# Patient Record
Sex: Female | Born: 1945 | Race: White | Hispanic: No | Marital: Married | Smoking: Never smoker
Health system: Southern US, Community
[De-identification: ages and names within clinical notes are randomized; demographics above are authoritative.]

## PROBLEM LIST (undated history)

## (undated) DIAGNOSIS — J302 Other seasonal allergic rhinitis: Secondary | ICD-10-CM

## (undated) DIAGNOSIS — F32A Depression, unspecified: Secondary | ICD-10-CM

## (undated) DIAGNOSIS — C189 Malignant neoplasm of colon, unspecified: Secondary | ICD-10-CM

## (undated) DIAGNOSIS — F0390 Unspecified dementia without behavioral disturbance: Secondary | ICD-10-CM

## (undated) DIAGNOSIS — F329 Major depressive disorder, single episode, unspecified: Secondary | ICD-10-CM

## (undated) DIAGNOSIS — C50919 Malignant neoplasm of unspecified site of unspecified female breast: Secondary | ICD-10-CM

## (undated) DIAGNOSIS — R0989 Other specified symptoms and signs involving the circulatory and respiratory systems: Secondary | ICD-10-CM

## (undated) HISTORY — PX: TUBAL LIGATION: SHX77

## (undated) HISTORY — PX: GALLBLADDER SURGERY: SHX652

## (undated) HISTORY — DX: Other seasonal allergic rhinitis: J30.2

## (undated) HISTORY — DX: Depression, unspecified: F32.A

## (undated) HISTORY — PX: OTHER SURGICAL HISTORY: SHX169

## (undated) HISTORY — PX: BREAST SURGERY: SHX581

## (undated) HISTORY — DX: Other specified symptoms and signs involving the circulatory and respiratory systems: R09.89

## (undated) HISTORY — DX: Malignant neoplasm of colon, unspecified: C18.9

## (undated) HISTORY — DX: Major depressive disorder, single episode, unspecified: F32.9

## (undated) HISTORY — DX: Malignant neoplasm of unspecified site of unspecified female breast: C50.919

## (undated) HISTORY — PX: HERNIA REPAIR: SHX51

---

## 2001-09-29 ENCOUNTER — Encounter (HOSPITAL_BASED_OUTPATIENT_CLINIC_OR_DEPARTMENT_OTHER): Payer: Self-pay | Admitting: General Surgery

## 2001-10-01 ENCOUNTER — Ambulatory Visit (HOSPITAL_COMMUNITY): Admission: RE | Admit: 2001-10-01 | Discharge: 2001-10-02 | Payer: Self-pay | Admitting: General Surgery

## 2008-01-13 ENCOUNTER — Ambulatory Visit: Payer: Self-pay | Admitting: Nurse Practitioner

## 2009-03-15 ENCOUNTER — Ambulatory Visit: Payer: Self-pay | Admitting: Nurse Practitioner

## 2009-11-19 DIAGNOSIS — C50919 Malignant neoplasm of unspecified site of unspecified female breast: Secondary | ICD-10-CM

## 2009-11-19 HISTORY — PX: MASTECTOMY: SHX3

## 2009-11-19 HISTORY — DX: Malignant neoplasm of unspecified site of unspecified female breast: C50.919

## 2010-04-18 ENCOUNTER — Ambulatory Visit: Payer: Self-pay | Admitting: Family Medicine

## 2010-05-02 ENCOUNTER — Ambulatory Visit: Payer: Self-pay | Admitting: Family Medicine

## 2010-07-07 IMAGING — US ULTRASOUND LEFT BREAST
1 series · 8 of 8 positions shown · non-contrast
Comparison: none

REASON FOR EXAM: DIAGNOSTIC MAMMO LT BR DISCHARGE
COMMENTS:

PROCEDURE:     US  - US BREAST LEFT  - April 18, 2010  [DATE]
RESULT:      The periareolar region the left breast was scanned
sonographically.    No cystic or solid masses are identified.  No ductal
dilation was evident.

[Series 1: ultrasound left breast · 8 of 8 slices shown]
[im 1/8]
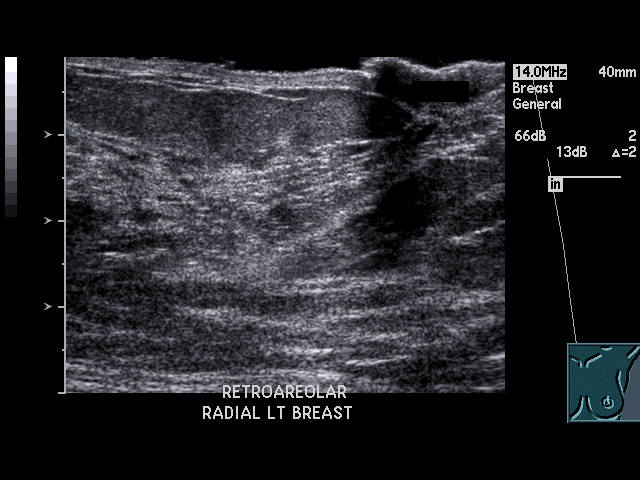
[im 2/8]
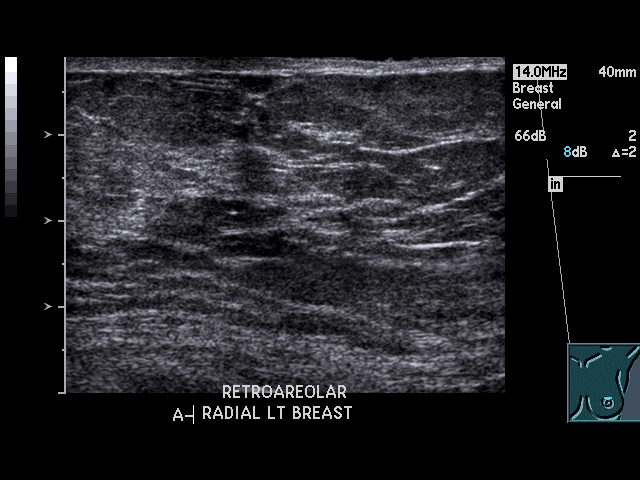
[im 3/8]
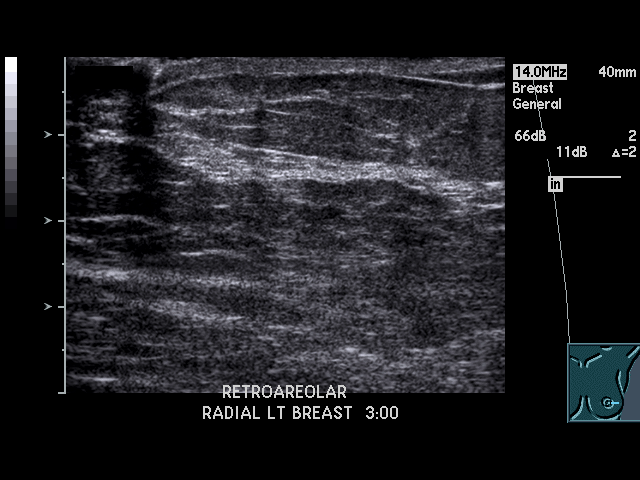
[im 4/8]
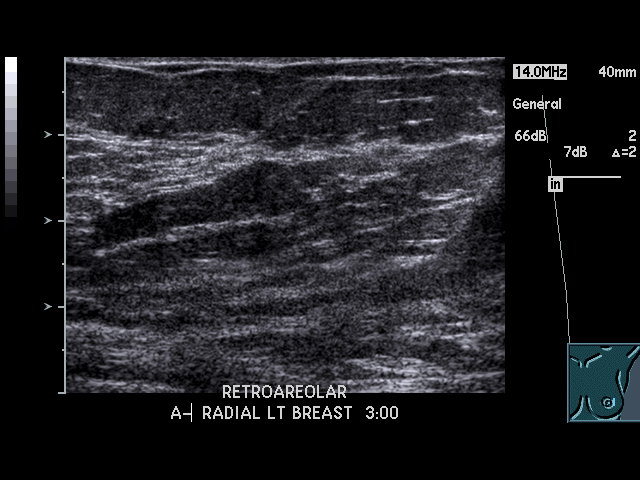
[im 5/8]
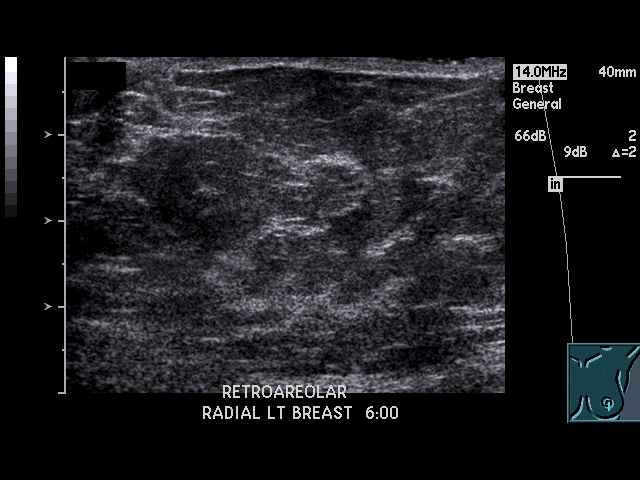
[im 6/8]
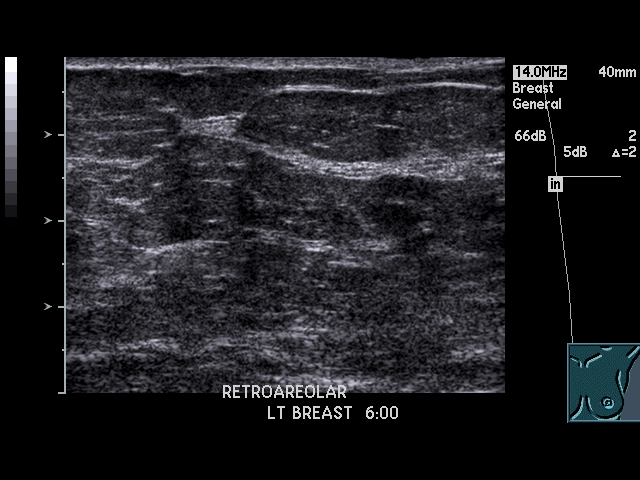
[im 7/8]
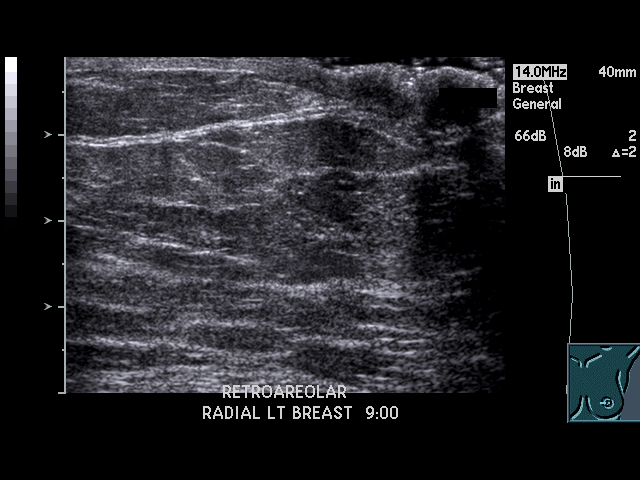
[im 8/8]
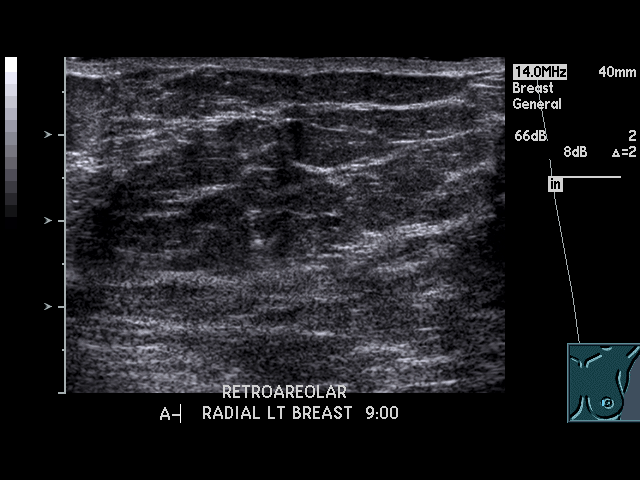

[8 of 8 positions shown; findings below may reference images not displayed]

IMPRESSION: Negative targeted ultrasound of the left breast.

## 2012-07-01 DIAGNOSIS — Z531 Procedure and treatment not carried out because of patient's decision for reasons of belief and group pressure: Secondary | ICD-10-CM | POA: Insufficient documentation

## 2012-07-01 DIAGNOSIS — IMO0001 Reserved for inherently not codable concepts without codable children: Secondary | ICD-10-CM | POA: Insufficient documentation

## 2012-07-01 DIAGNOSIS — F329 Major depressive disorder, single episode, unspecified: Secondary | ICD-10-CM | POA: Insufficient documentation

## 2012-07-01 DIAGNOSIS — E039 Hypothyroidism, unspecified: Secondary | ICD-10-CM | POA: Insufficient documentation

## 2012-07-01 DIAGNOSIS — D0510 Intraductal carcinoma in situ of unspecified breast: Secondary | ICD-10-CM | POA: Insufficient documentation

## 2012-07-01 DIAGNOSIS — Z789 Other specified health status: Secondary | ICD-10-CM | POA: Insufficient documentation

## 2012-07-01 DIAGNOSIS — C19 Malignant neoplasm of rectosigmoid junction: Secondary | ICD-10-CM | POA: Insufficient documentation

## 2012-07-01 DIAGNOSIS — J45909 Unspecified asthma, uncomplicated: Secondary | ICD-10-CM | POA: Insufficient documentation

## 2012-07-04 DIAGNOSIS — K219 Gastro-esophageal reflux disease without esophagitis: Secondary | ICD-10-CM | POA: Insufficient documentation

## 2012-07-04 DIAGNOSIS — K439 Ventral hernia without obstruction or gangrene: Secondary | ICD-10-CM | POA: Insufficient documentation

## 2012-09-08 DIAGNOSIS — G3184 Mild cognitive impairment, so stated: Secondary | ICD-10-CM | POA: Insufficient documentation

## 2013-11-09 ENCOUNTER — Ambulatory Visit (INDEPENDENT_AMBULATORY_CARE_PROVIDER_SITE_OTHER): Payer: Medicare Other

## 2013-11-09 ENCOUNTER — Encounter: Payer: Self-pay | Admitting: Podiatry

## 2013-11-09 ENCOUNTER — Ambulatory Visit (INDEPENDENT_AMBULATORY_CARE_PROVIDER_SITE_OTHER): Payer: Medicare Other | Admitting: Podiatry

## 2013-11-09 VITALS — BP 124/69 | HR 74 | Resp 16 | Ht 67.0 in | Wt 209.0 lb

## 2013-11-09 DIAGNOSIS — M2010 Hallux valgus (acquired), unspecified foot: Secondary | ICD-10-CM

## 2013-11-09 DIAGNOSIS — M2040 Other hammer toe(s) (acquired), unspecified foot: Secondary | ICD-10-CM

## 2013-11-09 DIAGNOSIS — L84 Corns and callosities: Secondary | ICD-10-CM

## 2013-11-09 DIAGNOSIS — L97509 Non-pressure chronic ulcer of other part of unspecified foot with unspecified severity: Secondary | ICD-10-CM

## 2013-11-09 MED ORDER — CLINDAMYCIN HCL 150 MG PO CAPS: 150.0000 mg | ORAL_CAPSULE | Freq: Three times a day (TID) | ORAL | Status: DC

## 2013-11-09 NOTE — Progress Notes (Signed)
   Subjective:    Patient ID: Maria Mays, female    DOB: [birth year], 67 y.o.   MRN: [id number]  HPI Comments: N pain L right foot 2nd digit corn/2nd digit left/toenail 2nd right D ? O slowly C worse A ? T soaks in epson salt, antibiotic cream,    Foot Pain Associated symptoms include headaches.      Review of Systems  Constitutional: Negative.   HENT:       Sinus problems  Eyes: Negative.   Respiratory: Negative.   Cardiovascular: Negative.   Gastrointestinal: Positive for diarrhea.  Endocrine: Negative.   Genitourinary: Negative.   Musculoskeletal:       Joint pain  Skin:       Change in nails  Allergic/Immunologic: Positive for environmental allergies and food allergies.  Neurological: Positive for headaches.  Hematological: Negative.   Psychiatric/Behavioral: Positive for confusion. The patient is nervous/anxious.        Objective:   Physical Exam: I have reviewed her past medical history medications allergies surgeries social history and review of systems. Vital signs are stable she is alert and oriented x3. Pulses are strongly palpable bilateral, +2/4 DP and PT bilateral. Capillary fill time to digits one through 5 of the bilateral foot is immediate. Neurologic sensorium is intact per Semmes-Weinstein monofilament. Deep tendon reflexes are intact bilateral muscle strength is 5 over 5 dorsiflexors plantar flexors inverters everters all intrinsic musculature is intact. 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 Hammertoe deformity second digit bilateral. This deformity is resulting in irritation and skin breakdown to the medial aspect of the PIPJ second digit of the right foot with surrounding area of reactive hyperkeratosis. Upon debridement of this area it does not probe to bone nor does it probe to capsule superficial skin breakdown is noted but does demonstrate an area of cellulitis. Radiographic evaluation  demonstrates moderate to severe hallux abductovalgus deformities bilateral. With severe hammertoe deformities second bilateral. I am unable to appreciate any signs of osteomyelitis to the second digit at this point. Cutaneous evaluation demonstrates supple well hydrated cutis with exception of the after mentioned lesion medial aspect PIPJ second digit right foot with cellulitis.        Assessment & Plan:  Assessment: Cellulitis secondary to anatomical deformity and superficial skin breakdown medial aspect second digit right foot.  Plan: Debridement of reactive hyperkeratosis. Discussed soaking and dressing therapy. I also put her on an oral antibiotic consisting of clindamycin 150 mg 3 times a day. I will followup with her in 3 weeks

## 2013-12-07 ENCOUNTER — Encounter: Payer: Self-pay | Admitting: Podiatry

## 2013-12-07 ENCOUNTER — Ambulatory Visit (INDEPENDENT_AMBULATORY_CARE_PROVIDER_SITE_OTHER): Payer: Medicare Other | Admitting: Podiatry

## 2013-12-07 VITALS — BP 111/70 | HR 68 | Resp 16 | Ht 67.0 in | Wt 210.0 lb

## 2013-12-07 DIAGNOSIS — M2010 Hallux valgus (acquired), unspecified foot: Secondary | ICD-10-CM

## 2013-12-07 DIAGNOSIS — L02619 Cutaneous abscess of unspecified foot: Secondary | ICD-10-CM

## 2013-12-07 DIAGNOSIS — L03119 Cellulitis of unspecified part of limb: Secondary | ICD-10-CM

## 2013-12-07 NOTE — Progress Notes (Signed)
She presents today to discuss her bunion deformities and her second toes with [place]. She states that the second toe and the erythema and infection has gotten better. She's finished up her antibiotics. She continues to soak daily.  Objective: Vital signs are stable she is alert and oriented x3. She has severe HAV deformities bilateral foot resulting in cockup hammertoe deformities and juxtaposition is resulting in reactive hyperkeratosis with skin breakdown. Right greater than left.  Assessment: Hallux valgus well-healing cellulitis right. Hammertoe deformities 2 through 5 bilateral.  Plan: Discussed etiology pathology conservative versus surgical therapies. And I will followup with her when she is ready for surgical intervention.

## 2014-08-21 ENCOUNTER — Ambulatory Visit: Payer: Self-pay | Admitting: Family Medicine

## 2014-09-09 ENCOUNTER — Ambulatory Visit: Payer: Self-pay | Admitting: Family Medicine

## 2014-09-29 ENCOUNTER — Ambulatory Visit (INDEPENDENT_AMBULATORY_CARE_PROVIDER_SITE_OTHER): Payer: Medicare Other | Admitting: Podiatry

## 2014-09-29 VITALS — BP 118/68 | HR 79 | Resp 16

## 2014-09-29 DIAGNOSIS — L03119 Cellulitis of unspecified part of limb: Secondary | ICD-10-CM

## 2014-09-29 DIAGNOSIS — M2041 Other hammer toe(s) (acquired), right foot: Secondary | ICD-10-CM

## 2014-09-29 DIAGNOSIS — L02619 Cutaneous abscess of unspecified foot: Secondary | ICD-10-CM

## 2014-09-29 MED ORDER — MUPIROCIN 2 % EX OINT: TOPICAL_OINTMENT | CUTANEOUS | Status: DC

## 2014-09-29 NOTE — Progress Notes (Signed)
She presents today with a painful callus to the second digit of the right foot. She states there has been some drainage. She is currently taking clindamycin from a previous doctor however she is not taking it regularly. She states that the toe still hurts as well as the hallux.  Objective: Pulses are palpable right foot. Hammertoe deformity second digit of the right foot in juxtaposition with hallux valgus deformity resulting in an irritation of the medial aspect of the DIPJ in the lateral aspect of the IP joint of the hallux right. Overlying the DIPJ medial aspect of the second toe right foot is a reactive hyperkeratosis was debrided reveals a superficial ulceration and abscess. This abscess was incised and drained. There was not enough purulence for culture. This didn't probe deep to capsule however it does not demonstrate signs of osteomyelitis.  Assessment: Abscess with digital deformity second digit right foot superficial ulceration second digit right foot.  Plan: Continue clindamycin 300 mg 3 times a day for the next 7 days which she has remaining in her pill bottle. I also wrote a prescription for Bactroban ointment to be applied twice daily after soaking in Epsom salts and water. She will then dressed the toe with a light dressing and we also dispensed a Darco shoe. I will follow-up with her in 2 weeks. X-rays will be taken at that time.

## 2014-10-20 ENCOUNTER — Ambulatory Visit (INDEPENDENT_AMBULATORY_CARE_PROVIDER_SITE_OTHER): Payer: Medicare Other | Admitting: Podiatry

## 2014-10-20 ENCOUNTER — Ambulatory Visit (INDEPENDENT_AMBULATORY_CARE_PROVIDER_SITE_OTHER): Payer: Medicare Other

## 2014-10-20 VITALS — BP 107/62 | HR 74 | Resp 16

## 2014-10-20 DIAGNOSIS — L97511 Non-pressure chronic ulcer of other part of right foot limited to breakdown of skin: Secondary | ICD-10-CM

## 2014-10-20 DIAGNOSIS — M2041 Other hammer toe(s) (acquired), right foot: Secondary | ICD-10-CM

## 2014-10-21 NOTE — Progress Notes (Signed)
She presents today for follow-up of ulceration to toe right foot. She states it is just so sore.  Objective: Ulceration without complication right foot.  Assessment: Ulceration right.  Plan: Debridement of one today. Continue cervical therapies follow up with me in the near future.

## 2014-11-02 ENCOUNTER — Emergency Department: Payer: Self-pay | Admitting: Emergency Medicine

## 2014-11-05 DIAGNOSIS — S52502A Unspecified fracture of the lower end of left radius, initial encounter for closed fracture: Secondary | ICD-10-CM | POA: Insufficient documentation

## 2014-11-08 ENCOUNTER — Telehealth: Payer: Self-pay | Admitting: *Deleted

## 2014-11-08 NOTE — Telephone Encounter (Signed)
Pt state she and her husband have an appt 11/22/2014 with Dr. Milinda Pointer, and at a previous appt she was instructed to get new shoes.  Pt states she has not purchased new shoes yet and wondered if she needed to keep the appt.

## 2014-11-08 NOTE — Telephone Encounter (Signed)
TRIED CALLING WORK # AND THEY STATED I WAS CALLING AN ANSWERING SERVICE OF [facility] AND NO ONE IS THERE BY THAT NAME.

## 2014-11-08 NOTE — Telephone Encounter (Signed)
TRIED CALLING PT BACK REGARDING HER APPT. WAS UNABLE TO LEAVE A MESSAGE.

## 2014-11-22 ENCOUNTER — Ambulatory Visit: Payer: Medicare Other | Admitting: Podiatry

## 2014-12-24 ENCOUNTER — Ambulatory Visit: Payer: Self-pay | Admitting: Family Medicine

## 2016-02-01 ENCOUNTER — Other Ambulatory Visit: Payer: Self-pay | Admitting: Family Medicine

## 2016-02-02 ENCOUNTER — Other Ambulatory Visit: Payer: Self-pay | Admitting: Family Medicine

## 2016-02-02 DIAGNOSIS — Z1231 Encounter for screening mammogram for malignant neoplasm of breast: Secondary | ICD-10-CM

## 2016-02-10 ENCOUNTER — Other Ambulatory Visit: Payer: Self-pay | Admitting: Family Medicine

## 2016-02-10 ENCOUNTER — Ambulatory Visit
Admission: RE | Admit: 2016-02-10 | Discharge: 2016-02-10 | Disposition: A | Discharge status: Home / Self Care | Payer: Medicare HMO | Source: Ambulatory Visit | Attending: Family Medicine | Admitting: Family Medicine

## 2016-02-10 DIAGNOSIS — Z1231 Encounter for screening mammogram for malignant neoplasm of breast: Secondary | ICD-10-CM

## 2016-03-06 ENCOUNTER — Other Ambulatory Visit: Payer: Self-pay | Admitting: Family Medicine

## 2016-03-06 DIAGNOSIS — Z780 Asymptomatic menopausal state: Secondary | ICD-10-CM

## 2016-03-22 ENCOUNTER — Ambulatory Visit
Admission: RE | Admit: 2016-03-22 | Discharge: 2016-03-22 | Disposition: A | Discharge status: Home / Self Care | Payer: Medicare HMO | Source: Ambulatory Visit | Attending: Family Medicine | Admitting: Family Medicine

## 2016-03-22 DIAGNOSIS — M8580 Other specified disorders of bone density and structure, unspecified site: Secondary | ICD-10-CM | POA: Insufficient documentation

## 2016-03-22 DIAGNOSIS — Z780 Asymptomatic menopausal state: Secondary | ICD-10-CM | POA: Diagnosis not present

## 2016-03-22 DIAGNOSIS — Z13820 Encounter for screening for osteoporosis: Secondary | ICD-10-CM | POA: Diagnosis not present

## 2016-05-18 ENCOUNTER — Ambulatory Visit: Payer: Medicare HMO | Admitting: Sports Medicine

## 2017-02-12 ENCOUNTER — Encounter: Payer: Self-pay | Admitting: Neurology

## 2017-03-27 ENCOUNTER — Ambulatory Visit (INDEPENDENT_AMBULATORY_CARE_PROVIDER_SITE_OTHER): Payer: Medicare HMO | Admitting: Podiatry

## 2017-03-27 DIAGNOSIS — Q828 Other specified congenital malformations of skin: Secondary | ICD-10-CM | POA: Diagnosis not present

## 2017-03-27 DIAGNOSIS — M7751 Other enthesopathy of right foot: Secondary | ICD-10-CM

## 2017-03-27 DIAGNOSIS — M779 Enthesopathy, unspecified: Principal | ICD-10-CM

## 2017-03-27 DIAGNOSIS — M778 Other enthesopathies, not elsewhere classified: Secondary | ICD-10-CM

## 2017-03-27 NOTE — Progress Notes (Signed)
She presents today with chief complaint of pain to the second digit of the right foot. She states that she has a sore callused area present for several years. She states that she is using antibiotic ointment and Epsom salt soaks which did help some.  Objective: Vital signs are stable she's alert and oriented 3 pulses are palpable. She has mild hallux valgus deformity with overlapping second digit this resulting in an irritation due to juxtaposition along the medial aspect of the PIPJ second digit right foot. There is fluctuance beneath the skin which indicates probable bursitis radiographs taken today demonstrates hallux valgus with hammertoe deformity.  Assessment: Bursitis hammertoe deformity hallux valgus right foot.  Plan: Debrided reactive hyperkeratotic lesion today injected the PIPJ and bursitis D Smith as a local anesthetic provided padding and discussed appropriate shoe gear will follow up with her as needed.

## 2017-04-23 ENCOUNTER — Ambulatory Visit (INDEPENDENT_AMBULATORY_CARE_PROVIDER_SITE_OTHER): Payer: Medicare HMO | Admitting: Podiatry

## 2017-04-23 DIAGNOSIS — L84 Corns and callosities: Secondary | ICD-10-CM | POA: Diagnosis not present

## 2017-04-23 MED ORDER — CEPHALEXIN 500 MG PO CAPS
500.0000 mg | ORAL_CAPSULE | Freq: Two times a day (BID) | ORAL | 0 refills | Status: DC
Start: 2017-04-23 — End: 2018-07-25

## 2017-04-23 MED ORDER — MUPIROCIN 2 % EX OINT: 1.0000 "application " | TOPICAL_OINTMENT | Freq: Two times a day (BID) | CUTANEOUS | 0 refills | Status: DC

## 2017-04-24 ENCOUNTER — Telehealth: Payer: Self-pay | Admitting: *Deleted

## 2017-04-24 MED ORDER — MUPIROCIN 2 % EX OINT: TOPICAL_OINTMENT | CUTANEOUS | 0 refills | Status: DC

## 2017-04-24 NOTE — Progress Notes (Signed)
   Subjective: Patient is a 71 year old female presenting today with a complaint of a wound to the second toe of the right foot. She states the wound is healing but she is still experiencing some swelling and soreness to the touch. She denies drainage. She has been soaking the foot in Epsom salt which helps alleviate the pain. She did not finish the cephalexin prescribed because she misplaced the bottle.  Objective:  Physical Exam General: Alert and oriented x3 in no acute distress  Dermatology: Soft corn noted to medial aspect of second digit of the right foot. Pain on palpation with a central nucleated core noted.  Skin is warm, dry and supple bilateral lower extremities. Negative for open lesions or macerations.  Vascular: Palpable pedal pulses bilaterally. No edema or erythema noted. Capillary refill within normal limits.  Neurological: Epicritic and protective threshold grossly intact bilaterally.   Musculoskeletal Exam: Pain on palpation at the keratotic lesion noted. Range of motion within normal limits bilateral. Muscle strength 5/5 in all groups bilateral.  Assessment: #1 soft corn second digit right   Plan of Care:  #1 Patient evaluated #2 Excisional debridement of keratoic lesion using a chisel blade was performed without incident.  #3 Treated area(s) with Salinocaine and dressed with light dressing. #4 prescription for mupirocin ointment given to patient. #5 prescription for Keflex 500 mg #10 given to patient. #6 Patient is to return to the clinic PRN.   Edrick Kins, DPM [facility]  Dr. Edrick Kins, [address]                                        [place], [place] [address]                Office [phone]  Fax [phone]

## 2017-04-24 NOTE — Telephone Encounter (Addendum)
Maria Mays states needs verification for [employer] ointment. Escripted to put Bactroban ointment on affected area. Maria Mays states needs to find out where the ointment is to be applied. Left message at pt's pharmacy to apply the Mupirocin ointment to the right 2nd toe area.

## 2017-04-26 ENCOUNTER — Ambulatory Visit: Payer: Medicare HMO | Admitting: Neurology

## 2017-05-10 ENCOUNTER — Ambulatory Visit: Payer: Medicare HMO | Admitting: Podiatry

## 2017-05-17 ENCOUNTER — Ambulatory Visit (INDEPENDENT_AMBULATORY_CARE_PROVIDER_SITE_OTHER): Payer: Medicare HMO

## 2017-05-17 ENCOUNTER — Ambulatory Visit (INDEPENDENT_AMBULATORY_CARE_PROVIDER_SITE_OTHER): Payer: Medicare HMO | Admitting: Podiatry

## 2017-05-17 DIAGNOSIS — M2041 Other hammer toe(s) (acquired), right foot: Secondary | ICD-10-CM | POA: Diagnosis not present

## 2017-05-17 DIAGNOSIS — L84 Corns and callosities: Secondary | ICD-10-CM

## 2017-05-17 DIAGNOSIS — M21619 Bunion of unspecified foot: Secondary | ICD-10-CM

## 2017-05-17 DIAGNOSIS — M799 Soft tissue disorder, unspecified: Secondary | ICD-10-CM | POA: Diagnosis not present

## 2017-05-17 DIAGNOSIS — M7989 Other specified soft tissue disorders: Secondary | ICD-10-CM

## 2017-05-17 NOTE — Progress Notes (Signed)
   HPI: 71 year old female presents today for evaluation of a soft tissue mass to the medial aspect of her second digit right foot. Patient also has a concurrent bunion and hammertoe to the respective digit. Patient states that she still experiences some pain and tenderness although the callus overlying the soft tissue lesion was debrided last visit. She's been soaking the foot is still very sore. Patient presents today for further treatment and evaluation   Physical Exam: General: The patient is alert and oriented x3 in no acute distress.  Dermatology: Skin is warm, dry and supple bilateral lower extremities. Negative for open lesions or macerations.  Vascular: Palpable pedal pulses bilaterally. No edema or erythema noted. Capillary refill within normal limits.  Neurological: Epicritic and protective threshold grossly intact bilaterally.   Musculoskeletal Exam: There is a soft tissue mass noted to the medial aspect of the second digit right foot just overlying the DIPJ approximately 1 cm in diameter. Mass is palpable and appears non-adhered to underlying bone structure. There is also hammertoe contracture deformity of the second digit right foot with a bunion deformity of the right foot as well. The hammertoe contracture appears laterally deviated the level of the DIPJ second digit right foot  Radiographic Exam:  Increased intermetatarsal angle greater than 15 with a hallux abductus angle greater than 30 consistent with bunion deformity. Hammertoe contracture deformity with lateral deviation of the DIPJ second digit right foot. Joint spaces appear preserved with no fracture identified.  Assessment: 1. Soft tissue mass second digit right foot 2. Hammertoe contracture second digit right foot 3. Hallux abductovalgus deformity right foot  Plan of Care:  1. Patient was evaluated. X-rays reviewed today 2. Today we discussed additional conservative versus surgical management of the soft tissue  lesion as well as bunion and hammertoe which are contributory. At this point surgery appears to be warranted. There's been no alleviation of symptoms for the patient regarding conservative modalities. 3. We're going to stage the surgery. For surgery will be excision of the soft tissue lesion second digit right foot. This will be performed as an in office procedure in [place]. Authorization for surgery initiated today. 4. The second stage of the procedure will be to correct for the bunion and hammertoe deformity. This will be performed in the surgery center. We will obtain authorization once the soft tissue lesion incision heals. 5. Patient did have a concern regarding anesthesia at the surgery center. 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 Patient would not like to have Gen. anesthesia during the surgery. 6. Return to the office in [facility] for surgical excision of the soft tissue mass.   Edrick Kins, DPM [facility]  Dr. Edrick Kins, [address]                                        [place], [place] [address]                Office [phone]  Fax [phone]

## 2017-05-21 ENCOUNTER — Telehealth: Payer: Self-pay | Admitting: *Deleted

## 2017-05-21 NOTE — Telephone Encounter (Signed)
"  I need to schedule surgery on my right foot.  Please give me a call."  I'm returning your call.  You want to schedule surgery?  "Yes, I do but I'm going to need for you to speak to my daughter because she will be the one bringing me.  The number is [phone] and her name is [name]."  I left [name] a message to give me a call to set up an appointment for her mother.

## 2017-05-30 NOTE — Telephone Encounter (Signed)
"  I'm calling to set up my mom's surgery."  Do you have a date in mind?  "I would like for it to be scheduled after the twelfth of August."  Dr. Amalia Hailey can do it here in the [facility] office on 07/03/2017.  "Okay, that date will be fine."  She needs to be here at 7:45am.  "She is to use that scrub brush before she comes?"  Yes, she uses it to clean her foot the night before surgery date.

## 2017-06-13 ENCOUNTER — Telehealth: Payer: Self-pay | Admitting: *Deleted

## 2017-06-13 NOTE — Telephone Encounter (Signed)
"  I'm scheduled for surgery on Wednesday the 15th at the [place] location.  I just have a couple of questions if you could give me a call back, one thing I was wondering about is washing the foot the night before and day of surgery should I not have food.  I need more information about preparation.

## 2017-06-14 ENCOUNTER — Ambulatory Visit: Payer: Medicare HMO | Admitting: Neurology

## 2017-06-26 NOTE — Telephone Encounter (Signed)
I attempted to return the patient's call.

## 2017-07-01 NOTE — Telephone Encounter (Signed)
I am calling on behalf of my mother.  She's scheduled for surgery on Wednesday.  She is very worried so I am calling to ask some questions.  She wants to know what she needs to do prior to the surgery.  She said she has a scrub brush and needs to know when to use it."  She is to clean her foot the night before.  All she has to do is wet it and scrub the foot the night before.  "Does she have to put anything on it afterwards?"  She does not have to put anything on it.  "Where are you located?"  We are located at [address], [place] [address] from [employer].  "Okay, that helps.  Will she be wearing a shoe or a boot after surgery?"  She will receive a surgical shoe the day of surgery.  "Is there anything she will have to do after her surgery?"  She will get her instruction sheet when she comes in for the surgery.  It means elevating foot as much as possible and drinking plenty of fluids.  "Okay, we'll see you Wednesday."

## 2017-07-02 ENCOUNTER — Telehealth: Payer: Self-pay

## 2017-07-02 NOTE — Telephone Encounter (Signed)
Spoke with patient instructing her on pre-op instructions and arrival time.

## 2017-07-03 ENCOUNTER — Telehealth: Payer: Self-pay | Admitting: *Deleted

## 2017-07-03 ENCOUNTER — Ambulatory Visit (INDEPENDENT_AMBULATORY_CARE_PROVIDER_SITE_OTHER): Payer: Medicare HMO | Admitting: Podiatry

## 2017-07-03 ENCOUNTER — Other Ambulatory Visit: Payer: Self-pay | Admitting: Podiatry

## 2017-07-03 ENCOUNTER — Encounter: Payer: Self-pay | Admitting: Podiatry

## 2017-07-03 VITALS — BP 120/69 | HR 80 | Temp 96.8°F | Resp 16

## 2017-07-03 DIAGNOSIS — M799 Soft tissue disorder, unspecified: Secondary | ICD-10-CM

## 2017-07-03 DIAGNOSIS — M7989 Other specified soft tissue disorders: Secondary | ICD-10-CM

## 2017-07-03 MED ORDER — TRAMADOL HCL 50 MG PO TABS: 50.0000 mg | ORAL_TABLET | Freq: Four times a day (QID) | ORAL | 0 refills | Status: DC | PRN

## 2017-07-03 NOTE — Telephone Encounter (Signed)
I called and informed [name] that her mom forgot her prescription.  She can go by the [place] office to get one.

## 2017-07-04 NOTE — Progress Notes (Signed)
OPERATIVE REPORT Patient name: Maria Mays MRN: [id number] DOB: 1946-08-30  DOS:  07/03/2017  Preop Dx: Soft tissue mass second digit right foot Postop Dx: same  Procedure:  1. Excision of soft tissue tumor/mass second digit right foot  Surgeon: Edrick Kins DPM  Anesthesia: 50-50 mixture of 2% lidocaine plain with 0.5% Marcaine plain totaling 3 mL infiltrated in the patient's right lower extremity  Hemostasis: Ankle tourniquet inflated to a pressure of 229mmHg after esmarch exsanguination   EBL: 1 mL Materials: None Injectables: None Pathology: Soft tissue tumor right second digit  Condition: The patient tolerated the procedure and anesthesia well. No complications noted or reported   Justification for procedure: The patient is a 71 y.o. female who presents today for surgical correction of a soft tissue lesion to the medial aspect of the second digit right foot. All conservative modalities of been unsuccessful in providing any sort of satisfactory alleviation of symptoms with the patient. The patient was told benefits as well as possible side effects of the surgery. The patient consented for surgical correction. The patient consent form was reviewed. All patient questions were answered. No guarantees were expressed or implied. The patient and the surgeon boson the patient consent form with the witness present and placed in the patient's chart.   Procedure in Detail: The patient was brought to the operating room, placed in the operating table in the supine position at which time an aseptic scrub and drape were performed about the patient's respective lower extremity after anesthesia was induced as described above. Attention was then directed to the surgical area where procedure number one commenced.  Procedure #1: Excision of soft tissue lesion second digit right foot  A linear longitudinal elliptical type incision was planned and made along the medial aspect of the  second digit right foot. The ellipsed wedges of skin was approximately 1.5 cm in length 0.5 cm in width. The ellipsed wedges skin as well as the underlying soft tissue mass was removed in toto and placing sterile specimen container and sent to pathology. Care was taken to cut clamped ligated or retracted way all small neurovascular structures traversing the incision site. Copious irrigation was then utilized and primary closure was obtained using 5-0 nylon suture.  Dry sterile compressive dressings were then applied to all previously mentioned incision sites about the patient's lower extremity. The tourniquet which was used for hemostasis was deflated. All normal neurovascular responses including pink color and warmth returned all the digits of patient's lower extremity.  The patient was then transferred from the operating room to the recovery room having tolerated the procedure and anesthesia well. All vital signs are stable. After a brief stay in the recovery room the patient was discharged to her home with adequate prescriptions for analgesia. Verbal as well as written instructions were provided for the patient regarding wound care. The patient is to keep the dressings clean dry and intact until they are to follow surgeon Dr. Daylene Katayama in the office upon discharge.   Edrick Kins, DPM [facility]  Dr. Edrick Kins, [address]                                        [place], [place] [address]                Office [phone]  Fax [phone]

## 2017-07-08 LAB — PATHOLOGY

## 2017-07-09 ENCOUNTER — Ambulatory Visit (INDEPENDENT_AMBULATORY_CARE_PROVIDER_SITE_OTHER): Payer: Medicare HMO | Admitting: Podiatry

## 2017-07-09 VITALS — BP 118/74 | HR 52 | Temp 99.0°F | Resp 18

## 2017-07-09 DIAGNOSIS — M2041 Other hammer toe(s) (acquired), right foot: Secondary | ICD-10-CM

## 2017-07-09 DIAGNOSIS — M21619 Bunion of unspecified foot: Secondary | ICD-10-CM

## 2017-07-16 ENCOUNTER — Ambulatory Visit (INDEPENDENT_AMBULATORY_CARE_PROVIDER_SITE_OTHER): Payer: Medicare HMO | Admitting: Podiatry

## 2017-07-16 ENCOUNTER — Encounter: Payer: Self-pay | Admitting: Podiatry

## 2017-07-16 DIAGNOSIS — M2041 Other hammer toe(s) (acquired), right foot: Secondary | ICD-10-CM | POA: Diagnosis not present

## 2017-07-16 DIAGNOSIS — M21611 Bunion of right foot: Secondary | ICD-10-CM | POA: Diagnosis not present

## 2017-07-16 DIAGNOSIS — M2011 Hallux valgus (acquired), right foot: Secondary | ICD-10-CM | POA: Diagnosis not present

## 2017-07-17 ENCOUNTER — Telehealth: Payer: Self-pay | Admitting: *Deleted

## 2017-07-17 NOTE — Telephone Encounter (Addendum)
"  I would like to schedule surgery for my mom."  Has she signed consent forms?  "Yes, she signed them yesterday in the [place] office."  Do you have a date in mind?  "Yes, she'd like to do it September 20."  Unfortunately he doesn't have anything that day.  He can do it on October 4.  "That date is fine.  What time will she need to be there?"  Someone from the surgical center will call with the arrival time a day or two prior to surgery date.  I cannot give you an exact time because the surgical center may have to move people around due to cancellations or because children and diabetic patients have to go first."  Can you get them to call my number instead of my mom's number."

## 2017-07-17 NOTE — Progress Notes (Signed)
   Subjective:  Patient presents today status post  Excision of soft tissue camera/mass second digit right foot. DOS:  07/03/2017     Objective/Physical Exam Skin incisions appear to be well coapted with sutures and staples intact. No sign of infectious process noted. No dehiscence. No active bleeding noted. Moderate edema noted to the surgical extremity.   Assessment: 1. s/p excision of soft tissue tumor/mass 2nd digit right foot. DOS:  07/03/2017   Plan of Care:  1. Patient was evaluated.  Soft tissue biopsy results reviewed today negative for any malignancy 2. Today dressings were changed  3. Return to clinic in 1 week for surgical consultation regarding bunion and hammertoe deformity   Edrick Kins, DPM [facility]  Dr. Edrick Kins, [address]                                        [place], [place] [address]                Office [phone]  Fax [phone]

## 2017-07-19 ENCOUNTER — Ambulatory Visit (INDEPENDENT_AMBULATORY_CARE_PROVIDER_SITE_OTHER): Payer: Medicare HMO | Admitting: Neurology

## 2017-07-19 ENCOUNTER — Encounter: Payer: Self-pay | Admitting: Neurology

## 2017-07-19 ENCOUNTER — Other Ambulatory Visit: Payer: Medicare HMO

## 2017-07-19 VITALS — BP 140/72 | HR 83 | Ht 67.0 in | Wt 200.0 lb

## 2017-07-19 DIAGNOSIS — Z859 Personal history of malignant neoplasm, unspecified: Secondary | ICD-10-CM | POA: Diagnosis not present

## 2017-07-19 DIAGNOSIS — G3184 Mild cognitive impairment, so stated: Secondary | ICD-10-CM

## 2017-07-19 NOTE — Progress Notes (Signed)
NEUROLOGY CONSULTATION NOTE  Maria Mays MRN: [id number] DOB: Sep 23, 1946  Referring provider: Dr. Tomasa Hose Primary care provider: Dr. Tomasa Hose  Reason for consult:  Memory impairment  Dear Dr Clide Deutscher:  Thank you for your kind referral of Maria Mays for consultation of the above symptoms. Although her history is well known to you, please allow me to reiterate it for the purpose of our medical record. The patient was accompanied to the clinic by her 2 daughters who also provide collateral information. Records and images were personally reviewed where available.  HISTORY OF PRESENT ILLNESS: This is a 71 year old right-handed woman with a history of breast cancer, colon cancer, hypothyroidism, presenting for evaluation of worsening memory. When asked about her memory, she states "I can notice a little problem sometimes." She lives with her husband and son. Her 2 daughters reports memory changes started after her surgeries in 2011/2012, she cannot remember within a conversation what they were talking about. She repeats herself. She misplaces thing frequently. She used to be in charge of bills but when they started doing online banking, her husband took over. She stopped driving a year ago, family requested her to stop, she denied getting lost driving. She occasionally forgets her medications. Her daughters report she is more paranoid now than she used to be, she would think people walking outside would come in and steal from them. No hallucinations. 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 No difficulties with ADLs. She has trouble letting go and can focus on things "worrying herself to death." They report she has always been this way but is has worsened.  She has occasional sinus/allergy headaches. She denies any dizziness, diplopia, dysarthria/dysphagia, neck pain, focal  numbness/tingling/weakness, bowel/bladder dysfunction, anosmia, tremors. She had occasional back pain. She fell a couple of weeks ago and has a right boot s/p surgery for hammertoe/bunion. Her mother had memory issues. She denies any history of significant head injury, no alcohol use.   Laboratory Data: From PCP - [facility] 01/17/17 5.720  PAST MEDICAL HISTORY: Past Medical History:  Diagnosis Date  . Breast cancer ([place]) 2011   LT MASTECTOMY  . Colon cancer ([place])   . Depression   . Seasonal allergies   . Sinus complaint     PAST SURGICAL HISTORY: Past Surgical History:  Procedure Laterality Date  . BREAST SURGERY Left   . colon sugery     cancer  . GALLBLADDER SURGERY    . HERNIA REPAIR     x2   . MASTECTOMY Left 2011   BREAST CA  . TUBAL LIGATION      MEDICATIONS: Current Outpatient Prescriptions on File Prior to Visit  Medication Sig Dispense Refill  . albuterol (PROVENTIL) (2.5 MG/3ML) 0.083% nebulizer solution Take 2.5 mg by nebulization as needed for wheezing or shortness of breath.    . cephALEXin (KEFLEX) 500 MG capsule Take 1 capsule (500 mg total) by mouth 2 (two) times daily. 10 capsule 0  . diphenoxylate-atropine (LOMOTIL) 2.5-0.025 MG tablet TAKE ONE TO TWO TABLETS BY MOUTH EVERY 6 HOURS AS NEEDED FOR DIARRHEA** TAKE 3 TABS IN AM AND 2 TABS AT NIGHT IF NEEDED**    . levothyroxine (SYNTHROID, LEVOTHROID) 88 MCG tablet     . Loratadine (CLARITIN PO) Take by mouth daily.    . mupirocin ointment (BACTROBAN) 2 % Apply to affected area bid 30 g 0  .  PARoxetine (PAXIL) 20 MG tablet Take 20 mg by mouth daily.    . traMADol (ULTRAM) 50 MG tablet Take 1 tablet (50 mg total) by mouth every 6 (six) hours as needed. 30 tablet 0   No current facility-administered medications on file prior to visit.     ALLERGIES: Allergies  Allergen Reactions  . Iodides Hives  . Other Other (See Comments)    Other Reaction: Rash with tape Uncoded Allergy. Allergen: Shellfish, Other Reaction:  Hives/wheezing  . Iopamidol Hives  . Codeine Rash and Other (See Comments)  . Iodine Rash  . Levaquin [Levofloxacin In D5w] Rash  . Levofloxacin Rash  . Penicillins Rash and Hives  . Sulfa Antibiotics Rash and Hives    FAMILY HISTORY: Family History  Problem Relation Age of Onset  . Breast cancer Mother 71    SOCIAL HISTORY: Social History   Social History  . Marital status: Married    Spouse name: N/A  . Number of children: N/A  . Years of education: N/A   Occupational History  . Not on file.   Social History Main Topics  . Smoking status: Never Smoker  . Smokeless tobacco: Never Used  . Alcohol use No  . Drug use: No  . Sexual activity: Not on file   Other Topics Concern  . Not on file   Social History Narrative  . No narrative on file    REVIEW OF SYSTEMS: Constitutional: No fevers, chills, or sweats, no generalized fatigue, change in appetite Eyes: No visual changes, double vision, eye pain Ear, nose and throat: No hearing loss, ear pain, nasal congestion, sore throat Cardiovascular: No chest pain, palpitations Respiratory:  No shortness of breath at rest or with exertion, wheezes GastrointestinaI: No nausea, vomiting, diarrhea, abdominal pain, fecal incontinence Genitourinary:  No dysuria, urinary retention or frequency Musculoskeletal:  No neck pain, +back pain Integumentary: No rash, pruritus, skin lesions Neurological: as above Psychiatric: No depression, insomnia, anxiety Endocrine: No palpitations, fatigue, diaphoresis, mood swings, change in appetite, change in weight, increased thirst Hematologic/Lymphatic:  No anemia, purpura, petechiae. Allergic/Immunologic: no itchy/runny eyes, nasal congestion, recent allergic reactions, rashes  PHYSICAL EXAM: Vitals:   07/19/17 1320  BP: 140/72  Pulse: 83  SpO2: 96%   General: No acute distress Head:  Normocephalic/atraumatic Eyes: Fundoscopic exam shows bilateral sharp discs, no vessel changes,  exudates, or hemorrhages Neck: supple, no paraspinal tenderness, full range of motion Back: No paraspinal tenderness Heart: regular rate and rhythm Lungs: Clear to auscultation bilaterally. Vascular: No carotid bruits. Skin/Extremities: No rash, no edema, right foot in boot Neurological Exam: Mental status: alert and oriented to person, place, and time, no dysarthria or aphasia, Fund of knowledge is appropriate.  Recent and remote memory are impaired.  Attention and concentration are normal.    Able to name objects and repeat phrases.  Montreal Cognitive Assessment  07/19/2017  Visuospatial/ Executive (0/5) 5  Naming (0/3) 3  Attention: Read list of digits (0/2) 2  Attention: Read list of letters (0/1) 1  Attention: Serial 7 subtraction starting at 100 (0/3) 3  Language: Repeat phrase (0/2) 2  Language : Fluency (0/1) 1  Abstraction (0/2) 2  Delayed Recall (0/5) 1  Orientation (0/6) 6  Total 26   Cranial nerves: CN I: not tested CN II: pupils equal, round and reactive to light, visual fields intact, fundi unremarkable. CN III, IV, VI:  full range of motion, no nystagmus, no ptosis CN V: facial sensation intact CN VII: upper and lower  face symmetric CN VIII: hearing intact to finger rub CN IX, X: gag intact, uvula midline CN XI: sternocleidomastoid and trapezius muscles intact CN XII: tongue midline Bulk & Tone: normal, no fasciculations. Motor: 5/5 throughout with no pronator drift. Sensation: intact to light touch, cold, pin, vibration and joint position sense.  No extinction to double simultaneous stimulation.  Romberg test negative Deep Tendon Reflexes: +2 throughout, no ankle clonus Plantar responses: downgoing bilaterally Cerebellar: no incoordination on finger to nose, heel to shin. No dysdiadochokinesia Gait: narrow-based and steady, able to tandem walk adequately. Tremor: none  IMPRESSION: This is a 71 year old right-handed woman with a history of breast cancer, colon  cancer, hypothyroidism, presenting for worsening memory. MOCA score today 26/30, neurological exam normal. Symptoms suggestive of mild cognitive impairment. Mild cognitive impairment means there are serious cognitive problems by report and testing but the patient is functioning normally. Around 50% of MCI patients progress to dementia (functional impairment) over 5 years. Dementia implies that ADLs are currently compromised. MRI brain without contrast will be ordered to assess for underlying structural abnormality. She is allergic to gadolinium. Check B12 level. We discussed the importance of control of vascular risk factors and physical exercise and brain stimulation exercises for brain health. She was encouraged to proceed with counseling for hoarding. She will follow-up in 6 months and knows to call for any changes.   Thank you for allowing me to participate in the care of this patient. Please do not hesitate to call for any questions or concerns.   Ellouise Newer, M.D.  CC: Dr. Clide Deutscher

## 2017-07-19 NOTE — Patient Instructions (Addendum)
1. Schedule MRI brain without contrast We have sent a referral to [facility] for your MRI and they will call you directly to schedule your appt. They are located at [address] [facility]. If you need to contact them directly please call [phone].   2. Bloodwork for B12 level Your provider has requested that you have labwork completed today. Please go to [facility] ([id number]) on the second floor of this building before leaving the office today. You do not need to check in. If you are not called within 15 minutes please check with the front desk.   3. Control of blood pressure, cholesterol, as well as physical exercise and brain stimulation exercises are important for brain health 4. Proceed with therapy for hoarding as planned 5. Follow-up in 6 months, call for any changes

## 2017-07-20 LAB — VITAMIN B12: Vitamin B-12: 347 pg/mL (ref 200–1100)

## 2017-07-23 ENCOUNTER — Telehealth: Payer: Self-pay

## 2017-07-23 NOTE — Telephone Encounter (Signed)
-----   Message from Cameron Sprang, MD sent at 07/23/2017 10:08 AM EDT ----- Pls let her know B12 level is normal, thanks

## 2017-07-23 NOTE — Telephone Encounter (Signed)
LMOM relaying message below.  

## 2017-07-23 NOTE — Telephone Encounter (Signed)
Pt returned my call.  Relaying message below

## 2017-07-24 NOTE — Progress Notes (Signed)
   Subjective: 71 year old female presents today for follow-up evaluation regarding excision of the soft tissue mass to the second digit of the right foot. Patient is doing very well today. The toe is moderately swollen secondary to the surgery. Patient otherwise has no complaints and denies any significant pain. Patient also presents today regarding evaluation and surgical consultation for bunion and hammertoe deformity to the right foot.    Past Medical History:  Diagnosis Date  . Breast cancer ([place]) 2011   LT MASTECTOMY  . Colon cancer ([place])   . Depression   . Seasonal allergies   . Sinus complaint      Objective: Physical Exam General: The patient is alert and oriented x3 in no acute distress.  Dermatology: Skin is cool, dry and supple bilateral lower extremities. Negative for open lesions or macerations.  Vascular: Palpable pedal pulses bilaterally. No edema or erythema noted. Capillary refill within normal limits.  Neurological: Epicritic and protective threshold grossly intact bilaterally.   Musculoskeletal Exam: Clinical evidence of bunion deformity noted to the respective foot. There is a moderate pain on palpation range of motion of the first MPJ. Lateral deviation of the hallux noted consistent with hallux abductovalgus. Hammertoe contracture also noted on clinical exam to digitsTo the right foot Symptomatic pain on palpation and range of motion also noted to the metatarsal phalangeal joints of the respective hammertoe digits.    Radiographic Exam: Increased intermetatarsal angle greater than 15 with a hallux abductus angle greater than 30 noted on AP view. Moderate degenerative changes noted within the first MPJ. Contracture deformity also noted to the interphalangeal joints and MPJs of the digits of the respective hammertoes.    Assessment: 1. 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 Hammertoe deformity second digit right lower extremity   Plan of Care:    1. Patient was evaluated. X-rays taken last visit were again reviewed today. 2. Sutures were removed from the surgical incision site of the second digit. 3. Today authorization for surgery was obtained. Surgery will consist of a new neck to me with metatarsal osteotomy right foot. Arthroplasty second digit right foot. MPJ capsulotomy second right. 4. All possible complications and details of procedure were explained. All patient questions answered. No guarantees were expressed or implied. 5. Immobilization cam boot dispensed today 6. Return to clinic 1 week postop   Edrick Kins, DPM [facility]  Dr. Edrick Kins, [address]                                        [place], [place] [address]                Office [phone]  Fax [phone]

## 2017-07-25 ENCOUNTER — Encounter: Payer: Self-pay | Admitting: Neurology

## 2017-07-25 DIAGNOSIS — G3184 Mild cognitive impairment, so stated: Secondary | ICD-10-CM | POA: Insufficient documentation

## 2017-07-25 DIAGNOSIS — Z859 Personal history of malignant neoplasm, unspecified: Secondary | ICD-10-CM | POA: Insufficient documentation

## 2017-08-01 ENCOUNTER — Telehealth: Payer: Self-pay | Admitting: Neurology

## 2017-08-01 NOTE — Telephone Encounter (Signed)
[facility] called in regards to pt's MRI and that it was denied

## 2017-08-07 ENCOUNTER — Encounter: Payer: Self-pay | Admitting: Neurology

## 2017-08-07 ENCOUNTER — Other Ambulatory Visit: Payer: Self-pay

## 2017-08-07 DIAGNOSIS — F09 Unspecified mental disorder due to known physiological condition: Secondary | ICD-10-CM

## 2017-08-07 NOTE — Telephone Encounter (Signed)
Kathlee Nations from [facility] called and needs to know if we are going to do a Peer to Peer please call her at [phone] Patient appt is 08-15-17 at 4:00

## 2017-08-19 ENCOUNTER — Telehealth: Payer: Self-pay | Admitting: *Deleted

## 2017-08-19 NOTE — Telephone Encounter (Signed)
"  I'm scheduled for surgery on Thursday.  I haven't heard anything from anyone about my medications.  I can call and get a list from my doctor and fax it over to you."  I don't see anything that needs to be withheld prior to your surgery.  Do you take any blood thinners?  "No, I don't take any blood thinners."  Someone from the surgical center will call you a day or two prior to your surgery date with instructions or you can call them if you like.  "What's their fax number?"  Their fax number is [phone].  "Okay, thank you."

## 2017-08-19 NOTE — Telephone Encounter (Signed)
"  Can you send Maria Mays medication list?  Her daughter said she takes a lot of medications but she doesn't know the name of them all."  I'll send it.  "You can fax it to [phone]."  I'll send it over.  I sent requested information to [facility].

## 2017-08-21 ENCOUNTER — Telehealth: Payer: Self-pay

## 2017-08-21 NOTE — Telephone Encounter (Signed)
Spoke with pt relaying message below.   

## 2017-08-21 NOTE — Telephone Encounter (Signed)
-----   Message from Cameron Sprang, MD sent at 08/21/2017  8:43 AM EDT ----- Regarding: MRI results Pls let patient know MRI brain did not show any evidence of tumor, stroke, or bleed. It showed age-related changes, thanks

## 2017-08-22 ENCOUNTER — Other Ambulatory Visit: Payer: Self-pay | Admitting: *Deleted

## 2017-08-22 ENCOUNTER — Encounter: Payer: Self-pay | Admitting: Podiatry

## 2017-08-22 DIAGNOSIS — M7751 Other enthesopathy of right foot: Secondary | ICD-10-CM | POA: Diagnosis not present

## 2017-08-22 DIAGNOSIS — M2041 Other hammer toe(s) (acquired), right foot: Secondary | ICD-10-CM | POA: Diagnosis not present

## 2017-08-22 DIAGNOSIS — M2011 Hallux valgus (acquired), right foot: Secondary | ICD-10-CM | POA: Diagnosis not present

## 2017-08-22 MED ORDER — TRAMADOL HCL 50 MG PO TABS: 50.0000 mg | ORAL_TABLET | Freq: Four times a day (QID) | ORAL | 0 refills | Status: DC | PRN

## 2017-08-30 ENCOUNTER — Encounter: Payer: Medicare HMO | Admitting: Podiatry

## 2017-08-30 ENCOUNTER — Telehealth: Payer: Self-pay | Admitting: *Deleted

## 2017-08-30 NOTE — Telephone Encounter (Signed)
Pt's Dtr, [name] states pt's appt had to be changed to 09/03/2017, and her sister wanted to know what they needed to do for the dressing. I told [name] to leave the dressing in place, it was sterile and could often be left in place for 7-10 days. [name] states understanding.

## 2017-09-03 ENCOUNTER — Ambulatory Visit (INDEPENDENT_AMBULATORY_CARE_PROVIDER_SITE_OTHER): Payer: Medicare HMO | Admitting: Podiatry

## 2017-09-03 ENCOUNTER — Ambulatory Visit (INDEPENDENT_AMBULATORY_CARE_PROVIDER_SITE_OTHER): Payer: Medicare HMO

## 2017-09-03 VITALS — BP 101/64 | HR 69 | Temp 97.3°F

## 2017-09-03 DIAGNOSIS — M2041 Other hammer toe(s) (acquired), right foot: Secondary | ICD-10-CM | POA: Diagnosis not present

## 2017-09-03 DIAGNOSIS — Z9889 Other specified postprocedural states: Secondary | ICD-10-CM

## 2017-09-03 NOTE — Progress Notes (Signed)
   Subjective:  Patient presents today status post bunionectomy and hammertoe repair second digit right foot. DOS: 08/22/2017. Patient states that she's doing well. She does admit to walking without the immobilization cam boot around her house this week. Patient states that the pain is tolerable. She has no new complaints at this time.   Past Medical History:  Diagnosis Date  . Breast cancer ([place]) 2011   LT MASTECTOMY  . Colon cancer ([place])   . Depression   . Seasonal allergies   . Sinus complaint       Objective/Physical Exam Skin incisions appear to be well coapted with sutures and staples intact. There is a heavy amount of edema and erythema to the surgical forefoot. This is likely due to postsurgical, rather than infectious in nature. Despite edema and swelling the incisions are intact without any significant drainage. Neurovascular status intact  Radiographic Exam:  Orthopedic hardware and osteotomies sites appear to be stable with routine healing.  Assessment: 1. s/p bunionectomy and hammertoe repair second digit right foot. DOS: 08/22/2017   Plan of Care:  1. Patient was evaluated. X-rays reviewed 2. I reinforced today with the patient the necessity to either wear and immobilization cam boot or postoperative shoe during weightbearing 3. Postoperative shoe dispensed today 4. Dry sterile dressings were applied. Keep clean dry and intact until next appointment next week 5. Return to clinic in 1 week, if the erythema is unchanged and swelling is still persistent order oral antibiotics. The patient has bad side effects expenses heavy diarrhea with antibiotic use    Edrick Kins, DPM [facility]  Dr. Edrick Kins, [address]                                        [place], [place] [address]                Office [phone]  Fax [phone]

## 2017-09-06 ENCOUNTER — Encounter: Payer: Medicare HMO | Admitting: Podiatry

## 2017-09-10 ENCOUNTER — Ambulatory Visit (INDEPENDENT_AMBULATORY_CARE_PROVIDER_SITE_OTHER): Payer: Medicare HMO | Admitting: Podiatry

## 2017-09-10 DIAGNOSIS — Z9889 Other specified postprocedural states: Secondary | ICD-10-CM

## 2017-09-10 MED ORDER — DELAFLOXACIN MEGLUMINE 450 MG PO TABS: 1.0000 | ORAL_TABLET | Freq: Two times a day (BID) | ORAL | 0 refills | Status: DC

## 2017-09-10 NOTE — Progress Notes (Signed)
   Subjective: 71 year old female presents today status postbunionectomy and hammertoe repair second digit on theright foot. Date of surgery 08/22/2017. Patient states that she's doing well however she still has a significant amount of swelling and redness to the surgical site. Patient states that the pain has increased slightly over the past week.   Past Medical History:  Diagnosis Date  . Breast cancer ([place]) 2011   LT MASTECTOMY  . Colon cancer ([place])   . Depression   . Seasonal allergies   . Sinus complaint       Objective/Physical Exam Skin incisions appear to be well coapted with sutures and staples intact. Here is a significant amount of erythema with some swelling localized to the surgical site of the right forefoot. There is no proximal streaking or drainage coming from any incision site. Incisions are well coapted. Neurovascular status intact.   Assessment: 1. s/p bunion and hammertoe repair second digit right foot. DOS: 08/22/2017   Plan of Care:  1. Patient was evaluated.  2. Dry sterile dressings were applied today. Keep clean dry and intact for one more week 3. Prescription for Baxdela sent to [facility] pharmacy 4. Continue weightbearing as tolerated in postoperative shoe  5. Return to clinic in 1 week   Edrick Kins, DPM [facility]  Dr. Edrick Kins, [address]                                        [place], [place] [address]                Office [phone]  Fax [phone]

## 2017-09-20 ENCOUNTER — Ambulatory Visit (INDEPENDENT_AMBULATORY_CARE_PROVIDER_SITE_OTHER): Payer: Medicare HMO | Admitting: Podiatry

## 2017-09-20 DIAGNOSIS — Z9889 Other specified postprocedural states: Secondary | ICD-10-CM

## 2017-09-23 NOTE — Progress Notes (Signed)
   Subjective: 71 year old female presents today status postbunionectomy and hammertoe repair second digit on the right foot. Date of surgery 08/22/2017. She states she is doing better. She does report some soreness to the dorsal foot at the base of the toes. She rates this pain at 2-3/10. She is here for further evaluation and treatment.    Past Medical History:  Diagnosis Date  . Breast cancer ([place]) 2011   LT MASTECTOMY  . Colon cancer ([place])   . Depression   . Seasonal allergies   . Sinus complaint       Objective/Physical Exam Skin incisions appear to be well coapted with sutures and staples intact. Here is a significant amount of erythema with some swelling localized to the surgical site of the right forefoot. There is no proximal streaking or drainage coming from any incision site. Incisions are well coapted. Neurovascular status intact.   Assessment: 1. s/p bunion and hammertoe repair second digit right foot. DOS: 08/22/2017   Plan of Care:  1. Patient was evaluated.  2. Staples and percutaneous pin removed. 3. Finish oral Baxdela. 4. Continue weightbearing in postop shoe.  5. Return to clinic in 2 weeks for X-Ray.  Edrick Kins, DPM [facility]  Dr. Edrick Kins, [address]                                        [place], [place] [address]                Office [phone]  Fax [phone]

## 2017-09-27 ENCOUNTER — Ambulatory Visit (INDEPENDENT_AMBULATORY_CARE_PROVIDER_SITE_OTHER): Payer: Medicare HMO | Admitting: Podiatry

## 2017-09-27 DIAGNOSIS — Z9889 Other specified postprocedural states: Secondary | ICD-10-CM

## 2017-09-30 NOTE — Progress Notes (Signed)
   Subjective: 71 year old female presents today status postbunionectomy and hammertoe repair second digit on the right foot. Date of surgery 08/22/2017. She reports some swelling of the great toe and second toe of the right foot. She expresses concern that the 2nd digit is crooked. She has been wearing the post op shoe and taking Tramadol with significant relief of the pain. She is here for further evaluation and treatment.    Past Medical History:  Diagnosis Date  . Breast cancer ([place]) 2011   LT MASTECTOMY  . Colon cancer ([place])   . Depression   . Seasonal allergies   . Sinus complaint       Objective/Physical Exam Skin incisions appear to be well coapted. Moderate edema with erythema localized to the surgical site. Neurovascular status intact.   Assessment: 1. s/p bunion and hammertoe repair second digit right foot. DOS: 08/22/2017   Plan of Care:  1. Patient was evaluated.  2. Finish taking [place].  3. Compression anklet dispensed. 4. Toe crest pad dispensed.  5. Return to clinic in 1 week for follow up X-Ray and transition into regular shoes.   Edrick Kins, DPM [facility]  Dr. Edrick Kins, [address]                                        [place], [place] [address]                Office [phone]  Fax [phone]

## 2017-10-04 ENCOUNTER — Ambulatory Visit: Payer: Medicare HMO | Admitting: Podiatry

## 2017-10-04 ENCOUNTER — Ambulatory Visit (INDEPENDENT_AMBULATORY_CARE_PROVIDER_SITE_OTHER): Payer: Medicare HMO

## 2017-10-04 ENCOUNTER — Encounter: Payer: Self-pay | Admitting: Podiatry

## 2017-10-04 DIAGNOSIS — M21611 Bunion of right foot: Secondary | ICD-10-CM

## 2017-10-04 DIAGNOSIS — M2011 Hallux valgus (acquired), right foot: Secondary | ICD-10-CM

## 2017-10-04 DIAGNOSIS — Z9889 Other specified postprocedural states: Secondary | ICD-10-CM

## 2017-10-07 NOTE — Progress Notes (Signed)
   Subjective: 71 year old female presents today status postbunionectomy and hammertoe repair second digit on the right foot. Date of surgery 08/22/2017. She states she is doing well. She reports some mild soreness to the right foot. She reports associated redness and mild swelling. She is here for further evaluation and treatment.    Past Medical History:  Diagnosis Date  . Breast cancer ([place]) 2011   LT MASTECTOMY  . Colon cancer ([place])   . Depression   . Seasonal allergies   . Sinus complaint       Objective/Physical Exam Skin incisions appear to be well coapted. Moderate edema with erythema localized to the surgical site. Neurovascular status intact.  Radiographic Exam:  Orthopedic hardware and osteotomies sites appear to be stable with routine healing. Callus formation noted to osteotomy site of 1st metatarsal consistent with movement of the osteotomy site.    Assessment: 1. s/p bunion and hammertoe repair second digit right foot. DOS: 08/22/2017   Plan of Care:  1. Patient was evaluated. X-Rays reviewed. 2. Finish oral antibiotic Baxdela. Patient admits to not taking it regularly.  3. Continue wearing compression anklet. 4. Slowly transition into regular shoes as swelling decreases.  5. Return to clinic in 4 weeks.   Edrick Kins, DPM [facility]  Dr. Edrick Kins, [address]                                        [place], [place] [address]                Office [phone]  Fax [phone]

## 2017-10-18 ENCOUNTER — Encounter: Payer: Self-pay | Admitting: Podiatry

## 2017-10-18 ENCOUNTER — Ambulatory Visit (INDEPENDENT_AMBULATORY_CARE_PROVIDER_SITE_OTHER): Payer: Medicare HMO | Admitting: Podiatry

## 2017-10-18 DIAGNOSIS — Z9889 Other specified postprocedural states: Secondary | ICD-10-CM

## 2017-10-20 NOTE — Progress Notes (Signed)
   Subjective: 71 year old female presents today status post bunionectomy and hammertoe repair second digit on the right foot. Date of surgery 08/22/2017. She states she is doing well overall. She reports her right second toe is underneath the great toe. She reports associated redness of the dorsum of the foot. Patient is here for further evaluation and treatment.    Past Medical History:  Diagnosis Date  . Breast cancer ([place]) 2011   LT MASTECTOMY  . Colon cancer ([place])   . Depression   . Seasonal allergies   . Sinus complaint       Objective/Physical Exam Skin incisions appear to be well coapted. Moderate edema with erythema localized to the surgical site. Neurovascular status intact.   Assessment: 1. s/p bunion and hammertoe repair second digit right foot. DOS: 08/22/2017   Plan of Care:  1. Patient was evaluated.  2. Darco toe splint dispensed.  3. Orders for physical therapy placed today for 3 times weekly for 4 weeks. 4. Continue wearing compression anklet and good shoe gear.  5. Return to clinic in 6 weeks.  Edrick Kins, DPM [facility]  Dr. Edrick Kins, [address]                                        [place], [place] [address]                Office [phone]  Fax [phone]

## 2017-10-31 ENCOUNTER — Encounter: Payer: Self-pay | Admitting: Emergency Medicine

## 2017-10-31 ENCOUNTER — Emergency Department: Payer: Medicare HMO

## 2017-10-31 ENCOUNTER — Emergency Department
Admission: EM | Admit: 2017-10-31 | Discharge: 2017-10-31 | Disposition: A | Discharge status: Home / Self Care | Payer: Medicare HMO | Attending: Emergency Medicine | Admitting: Emergency Medicine

## 2017-10-31 DIAGNOSIS — Z853 Personal history of malignant neoplasm of breast: Secondary | ICD-10-CM | POA: Diagnosis not present

## 2017-10-31 DIAGNOSIS — R600 Localized edema: Secondary | ICD-10-CM

## 2017-10-31 DIAGNOSIS — Z85038 Personal history of other malignant neoplasm of large intestine: Secondary | ICD-10-CM | POA: Insufficient documentation

## 2017-10-31 DIAGNOSIS — Z79899 Other long term (current) drug therapy: Secondary | ICD-10-CM | POA: Diagnosis not present

## 2017-10-31 DIAGNOSIS — R2241 Localized swelling, mass and lump, right lower limb: Secondary | ICD-10-CM | POA: Insufficient documentation

## 2017-10-31 NOTE — ED Notes (Addendum)
Spoke with Dr. Kerman Passey about pt presentation, verbal order for [place] of RT lower leg at this time

## 2017-10-31 NOTE — ED Provider Notes (Signed)
[facility] Emergency Department Provider Note  Time seen: 8:48 PM  I have reviewed the triage vital signs and the nursing notes.   HISTORY  Chief Complaint Foot Swelling    HPI Maria Mays is a 71 y.o. female with a past medical history of cancer, depression, bunion foot surgery 2 months ago who presents to the emergency department for possible DVT.  According to the patient since her surgery 2 months ago she has continued to have mild swelling in her foot.  States the swelling is not gone down she saw her physical therapist today and had mentioned to her that she is having some calf discomfort so the physical therapist sent her to the emergency department to rule out a DVT.  Patient denies any history of DVT.  Denies any chest pain or shortness of breath.  Denies any discomfort currently.   Past Medical History:  Diagnosis Date  . Breast cancer ([place]) 2011   LT MASTECTOMY  . Colon cancer ([place])   . Depression   . Seasonal allergies   . Sinus complaint     Patient Active Problem List   Diagnosis Date Noted  . Mild cognitive impairment 07/25/2017  . History of cancer 07/25/2017    Past Surgical History:  Procedure Laterality Date  . BREAST SURGERY Left   . colon sugery     cancer  . GALLBLADDER SURGERY    . HERNIA REPAIR     x2   . MASTECTOMY Left 2011   BREAST CA  . TUBAL LIGATION      Prior to Admission medications   Medication Sig Start Date End Date Taking? Authorizing Provider  albuterol (PROVENTIL) (2.5 MG/3ML) 0.083% nebulizer solution Take 2.5 mg by nebulization as needed for wheezing or shortness of breath.    [provider]  cephALEXin (KEFLEX) 500 MG capsule Take 1 capsule (500 mg total) by mouth 2 (two) times daily. Patient not taking: Reported on 08/19/2017 04/23/17   Edrick Kins, DPM  Delafloxacin Meglumine (BAXDELA) 450 MG TABS Take 1 tablet by mouth every 12 (twelve) hours. 09/10/17   Edrick Kins, DPM   diphenoxylate-atropine (LOMOTIL) 2.5-0.025 MG tablet TAKE ONE TO TWO TABLETS BY MOUTH EVERY 6 HOURS AS NEEDED FOR DIARRHEA** TAKE 3 TABS IN AM AND 2 TABS AT NIGHT IF NEEDED** 06/23/14   [provider]  levothyroxine (SYNTHROID, LEVOTHROID) 88 MCG tablet  09/10/13   [provider]  Loratadine (CLARITIN PO) Take by mouth daily.    [provider]  mupirocin ointment (BACTROBAN) 2 % Apply to affected area bid 04/24/17   Edrick Kins, DPM  PARoxetine (PAXIL) 20 MG tablet Take 20 mg by mouth daily.    [provider]  traMADol (ULTRAM) 50 MG tablet Take 1 tablet (50 mg total) by mouth every 6 (six) hours as needed. 08/22/17   Edrick Kins, DPM    Allergies  Allergen Reactions  . Iodides Hives  . Other Other (See Comments)    Other Reaction: Rash with tape Uncoded Allergy. Allergen: Shellfish, Other Reaction: Hives/wheezing  . Iopamidol Hives  . Codeine Rash and Other (See Comments)  . Iodine Rash  . Levaquin [Levofloxacin In D5w] Rash  . Levofloxacin Rash  . Penicillins Rash and Hives  . Sulfa Antibiotics Rash and Hives    Family History  Problem Relation Age of Onset  . Breast cancer Mother 80  . Colon cancer Paternal Grandmother     Social History Social  History   Tobacco Use  . Smoking status: Never Smoker  . Smokeless tobacco: Never Used  Substance Use Topics  . Alcohol use: No  . Drug use: No    Review of Systems Constitutional: Negative for fever Cardiovascular: Negative for chest pain. Respiratory: Negative for shortness of breath. Gastrointestinal: Negative for abdominal pain Musculoskeletal: Mild right foot swelling mild calf tightness/discomfort. Neurological: Negative for headache All other ROS negative  ____________________________________________   PHYSICAL EXAM:  VITAL SIGNS: ED Triage Vitals  Enc Vitals Group     BP 10/31/17 1806 112/61     Pulse Rate 10/31/17 1806 79     Resp 10/31/17 1805 16     Temp  10/31/17 1805 97.8 F (36.6 C)     Temp Source 10/31/17 1805 Oral     SpO2 10/31/17 1805 98 %     Weight 10/31/17 1805 206 lb (93.4 kg)     Height 10/31/17 1805 5\' 7"  (1.702 m)     Head Circumference --      Peak Flow --      Pain Score 10/31/17 1805 0     Pain Loc --      Pain Edu? --      Excl. in [place]? --    Constitutional: Alert. Well appearing and in no distress. Eyes: Normal exam ENT   Head: Normocephalic and atraumatic   Mouth/Throat: Mucous membranes are moist. Cardiovascular: Normal rate, regular rhythm. No murmur Respiratory: Normal respiratory effort without tachypnea nor retractions. Breath sounds are clear Gastrointestinal: Soft and nontender. No distention.   Musculoskeletal: Slight pedal edema, 2+ DP pulse, nontender, slight calf tenderness.  No erythema. Neurologic:  Normal speech and language. No gross focal neurologic deficits Skin:  Skin is warm, dry and intact.  Psychiatric: Mood and affect are normal.       RADIOLOGY  Ultrasound negative for DVT  ____________________________________________   INITIAL IMPRESSION / ASSESSMENT AND PLAN / ED COURSE  Pertinent labs & imaging results that were available during my care of the patient were reviewed by me and considered in my medical decision making (see chart for details).  Patient presents to the emergency department for continued right foot swelling and calf discomfort.  Differential would include DVT, muscular strain, postsurgical edema, peripheral edema.  Overall the patient appears extremely well, very slight amount of pedal edema present, slight amount of calf tenderness.  Ultrasound is negative for DVT.  2+ DP pulse with good movement of the foot.  Currently wearing a foot brace.  I discussed with the patient elevating the foot especially at nighttime and when at rest to help reduce edema.  Patient will follow up with her doctor.  ____________________________________________   FINAL CLINICAL  IMPRESSION(S) / ED DIAGNOSES  Pedal edema   Harvest Dark, MD 10/31/17 2051

## 2017-10-31 NOTE — ED Notes (Signed)
Pt brought back to room 36 - pt sent in for [place] of her leg for r/o dvt. [place] is negative. Pt ambulated to bathroom and urine sample obtained in case needed. Pt has no complaints.

## 2017-10-31 NOTE — ED Triage Notes (Signed)
Pt to ED via POV with c/o RT foot swelling and and calf pain. Pt states she had foot surgery on 10/4. Pt noticed swelling and calf pain xfew days. Pt was sent here by physical therapy to rule out DVT.

## 2017-11-01 ENCOUNTER — Ambulatory Visit: Payer: Medicare HMO | Admitting: Podiatry

## 2017-11-14 ENCOUNTER — Ambulatory Visit: Payer: Medicare HMO

## 2017-11-14 DIAGNOSIS — M21611 Bunion of right foot: Principal | ICD-10-CM

## 2017-11-14 DIAGNOSIS — M2011 Hallux valgus (acquired), right foot: Secondary | ICD-10-CM

## 2017-11-14 DIAGNOSIS — M2041 Other hammer toe(s) (acquired), right foot: Secondary | ICD-10-CM

## 2017-11-15 ENCOUNTER — Ambulatory Visit (INDEPENDENT_AMBULATORY_CARE_PROVIDER_SITE_OTHER): Payer: Medicare HMO | Admitting: Podiatry

## 2017-11-15 ENCOUNTER — Encounter: Payer: Self-pay | Admitting: Podiatry

## 2017-11-15 ENCOUNTER — Ambulatory Visit (INDEPENDENT_AMBULATORY_CARE_PROVIDER_SITE_OTHER): Payer: Medicare HMO

## 2017-11-15 DIAGNOSIS — M2011 Hallux valgus (acquired), right foot: Secondary | ICD-10-CM | POA: Diagnosis not present

## 2017-11-15 DIAGNOSIS — M21611 Bunion of right foot: Secondary | ICD-10-CM

## 2017-11-15 DIAGNOSIS — M2041 Other hammer toe(s) (acquired), right foot: Secondary | ICD-10-CM

## 2017-11-15 MED ORDER — MELOXICAM 15 MG PO TABS
15.0000 mg | ORAL_TABLET | Freq: Every day | ORAL | 1 refills | Status: AC
Start: 2017-11-15 — End: 2017-12-15

## 2017-11-21 NOTE — Progress Notes (Signed)
   Subjective: 72 year old female presents today status post bunionectomy and hammertoe repair second digit on the right foot. Date of surgery 08/22/2017.  She states she is doing better.  She does report some intermittent soreness and associated swelling.  She states the second toe is turning under the great toe.  Patient is here for further evaluation and treatment.    Past Medical History:  Diagnosis Date  . Breast cancer ([place]) 2011   LT MASTECTOMY  . Colon cancer ([place])   . Depression   . Seasonal allergies   . Sinus complaint       Objective/Physical Exam Skin incisions appear to be well coapted. Moderate edema with erythema localized to the surgical site. Neurovascular status intact. Slight deviation medially of the second digit.  Radiographic Exam:  Orthopedic hardware and osteotomies sites appear to be stable with routine healing.  Assessment: 1. s/p bunion and hammertoe repair second digit right foot. DOS: 08/22/2017   Plan of Care:  1. Patient was evaluated.  X-rays reviewed. 2.  Full activity with no restrictions. 3.  Recommended good shoe gear. 4.  Prescription for meloxicam 15 mg as needed provided to patient. 5.  Return to clinic as needed as needed  Edrick Kins, DPM [facility]  Dr. Edrick Kins, [address]                                        [place], [place] [address]                Office [phone]  Fax [phone]

## 2017-12-20 NOTE — Progress Notes (Signed)
DOS 08/22/17 Bunionectomy w/ double osteotomy Rt, Hammertoe repair 2nd Rt, 2nd metatarsophalangeal joint capsulotomy Rt

## 2018-01-20 ENCOUNTER — Ambulatory Visit: Payer: Medicare HMO | Admitting: Neurology

## 2018-05-23 ENCOUNTER — Other Ambulatory Visit: Payer: Self-pay | Admitting: Family Medicine

## 2018-05-23 DIAGNOSIS — Z853 Personal history of malignant neoplasm of breast: Secondary | ICD-10-CM

## 2018-06-16 ENCOUNTER — Inpatient Hospital Stay: Admission: RE | Admit: 2018-06-16 | Payer: Medicare HMO | Source: Ambulatory Visit

## 2018-07-03 ENCOUNTER — Ambulatory Visit
Admission: RE | Admit: 2018-07-03 | Discharge: 2018-07-03 | Disposition: A | Discharge status: Home / Self Care | Payer: Medicare HMO | Source: Ambulatory Visit | Attending: Family Medicine | Admitting: Family Medicine

## 2018-07-03 DIAGNOSIS — Z1231 Encounter for screening mammogram for malignant neoplasm of breast: Secondary | ICD-10-CM | POA: Diagnosis not present

## 2018-07-03 DIAGNOSIS — Z853 Personal history of malignant neoplasm of breast: Secondary | ICD-10-CM | POA: Diagnosis present

## 2018-07-03 DIAGNOSIS — Z9012 Acquired absence of left breast and nipple: Secondary | ICD-10-CM | POA: Insufficient documentation

## 2018-07-22 ENCOUNTER — Ambulatory Visit: Payer: Medicare HMO | Admitting: Podiatry

## 2018-07-25 ENCOUNTER — Ambulatory Visit (INDEPENDENT_AMBULATORY_CARE_PROVIDER_SITE_OTHER): Payer: Medicare HMO

## 2018-07-25 ENCOUNTER — Ambulatory Visit: Payer: Medicare HMO | Admitting: Podiatry

## 2018-07-25 ENCOUNTER — Encounter: Payer: Self-pay | Admitting: Podiatry

## 2018-07-25 DIAGNOSIS — M2042 Other hammer toe(s) (acquired), left foot: Secondary | ICD-10-CM

## 2018-07-25 DIAGNOSIS — L989 Disorder of the skin and subcutaneous tissue, unspecified: Secondary | ICD-10-CM

## 2018-07-25 DIAGNOSIS — B353 Tinea pedis: Secondary | ICD-10-CM

## 2018-07-25 MED ORDER — CLOTRIMAZOLE-BETAMETHASONE 1-0.05 % EX CREA: 1.0000 "application " | TOPICAL_CREAM | Freq: Two times a day (BID) | CUTANEOUS | 1 refills | Status: DC

## 2018-07-29 NOTE — Progress Notes (Signed)
   Subjective: 72 year old female presenting to the clinic today with a chief complaint of pain and tenderness of the left second toe that began a few months ago. She reports associated redness and swelling of the toe. Wearing shoes increases the pain. She has been applying Neosporin for treatment. She also notes an itching rash to the dorsal right forefoot that has been present for the past several weeks. She denies modifying factors and has not done anything for treatment. Patient is here for further evaluation and treatment.   Past Medical History:  Diagnosis Date  . Breast cancer ([place]) 2011   LT MASTECTOMY  . Colon cancer ([place])   . Depression   . Seasonal allergies   . Sinus complaint      Objective:  Physical Exam General: Alert and oriented x3 in no acute distress  Dermatology: Hyperkeratotic lesion present on the left 2nd toe. Pain on palpation with a central nucleated core noted. Skin is warm, dry and supple bilateral lower extremities. Negative for open lesions or macerations. Pruritus to the right dorsal forefoot with hyperkeratosis.  Vascular: Palpable pedal pulses bilaterally. No edema or erythema noted. Capillary refill within normal limits.  Neurological: Epicritic and protective threshold grossly intact bilaterally.   Musculoskeletal Exam: Pain on palpation at the keratotic lesion noted. Range of motion within normal limits bilateral. Muscle strength 5/5 in all groups bilateral.  Radiographic Exam:  Normal osseous mineralization. Joint spaces preserved. No fracture/dislocation/boney destruction.    Assessment: 1. Tinea pedis right forefoot 2. 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 Patient evaluated. X-Rays reviewed.  2. Excisional debridement of keratoic lesion using a chisel blade was performed without incident.  3. Dressed area with light dressing. 4. Prescription for Lotrisone cream provided to patient.  5. Recommended good shoe gear.  6. Patient  is to return to the clinic PRN.   Edrick Kins, DPM [facility]  Dr. Edrick Kins, [address]                                        [place], [place] [address]                Office [phone]  Fax [phone]

## 2019-01-13 IMAGING — US US EXTREM LOW VENOUS*R*
1 series · 13 of 24 positions shown · non-contrast
Comparison: None.

CLINICAL DATA: Right lower extremity pain and swelling for 2
months. Recent right foot surgery.



[Series 1: us extrem low venous*right* · 0.08mm/px · 13 of 39 slices shown]
[im 1/39]
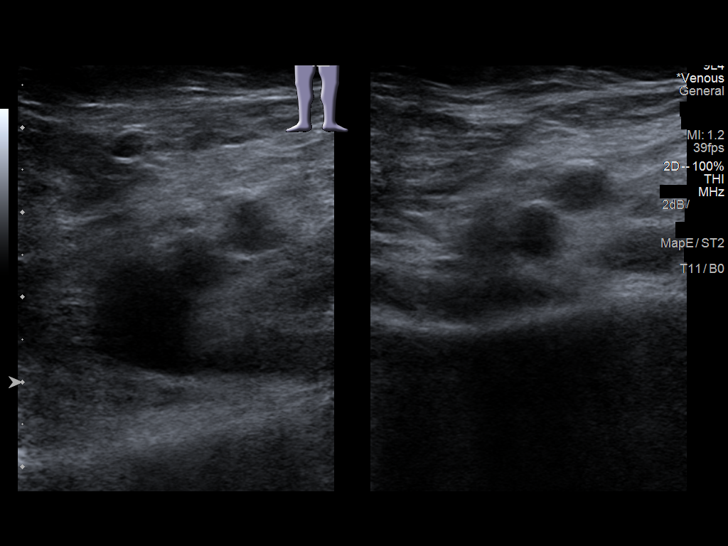
[im 4/39]
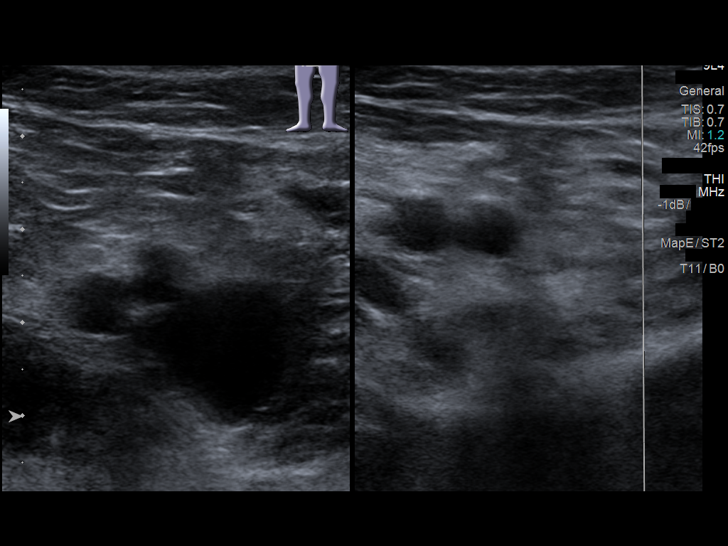
[im 7/39]
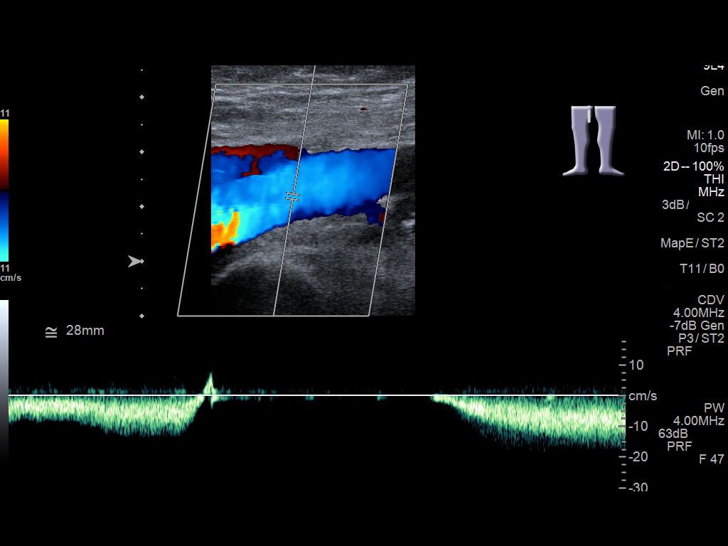
[im 10/39]
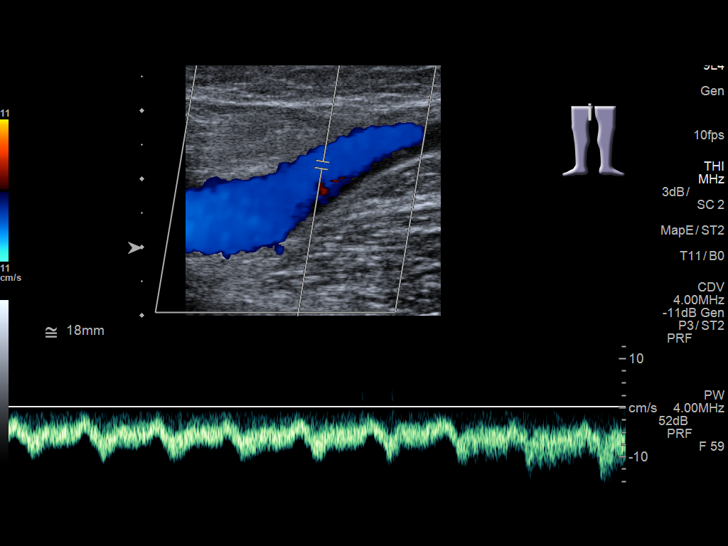
[im 14/39]
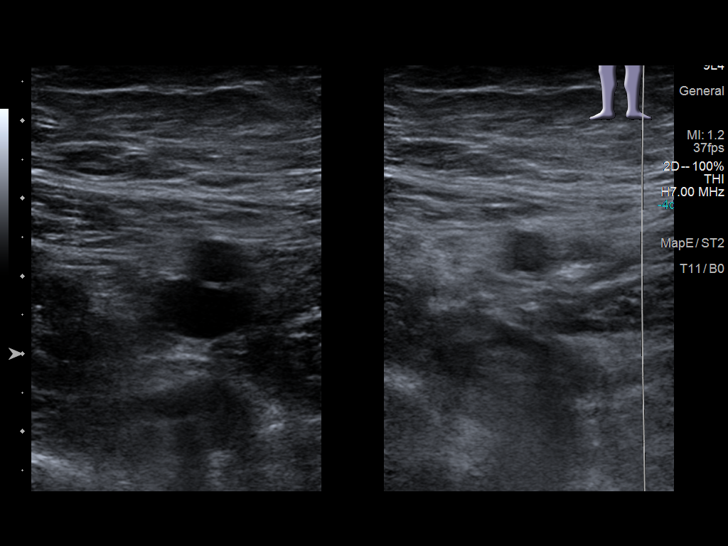
[im 17/39]
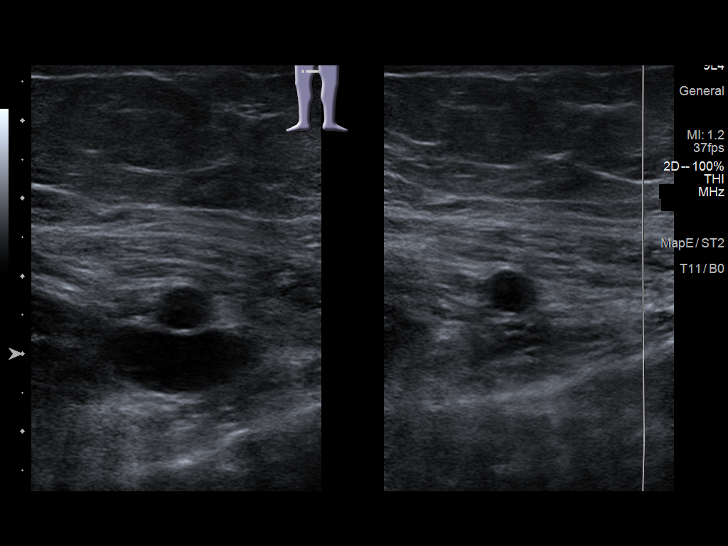
[im 20/39]
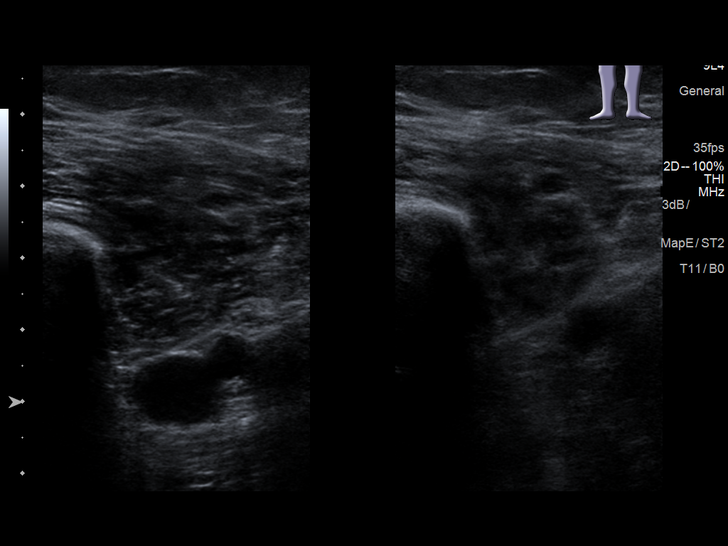
[im 22/39]
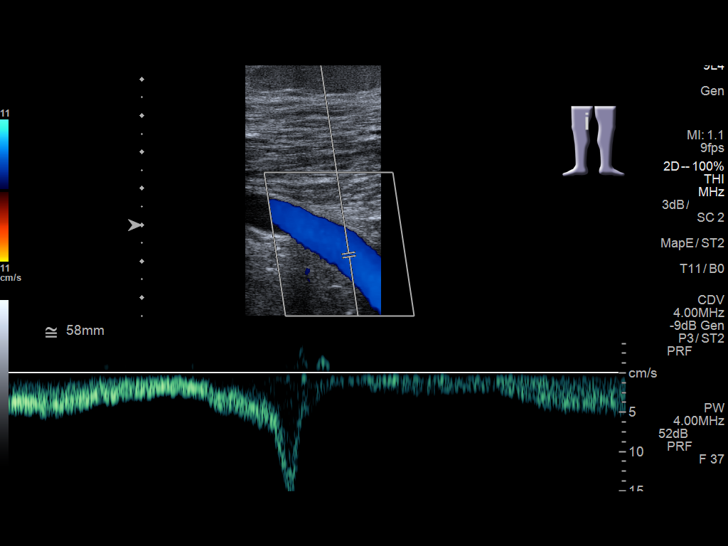
[im 25/39]
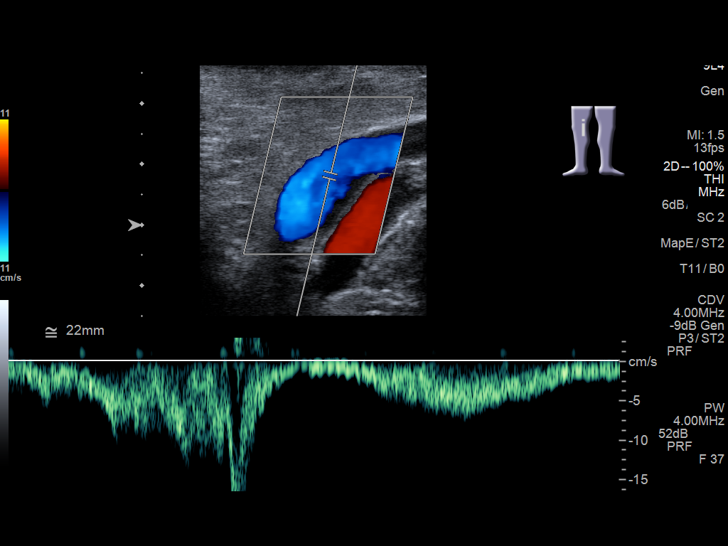
[im 29/39]
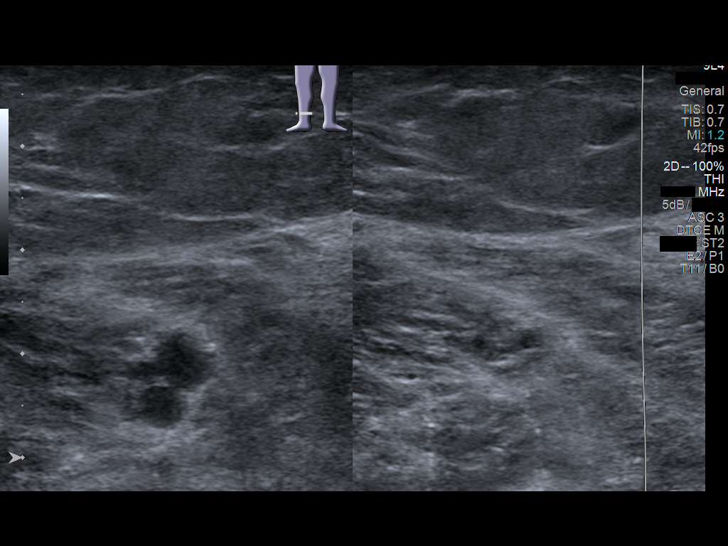
[im 32/39]
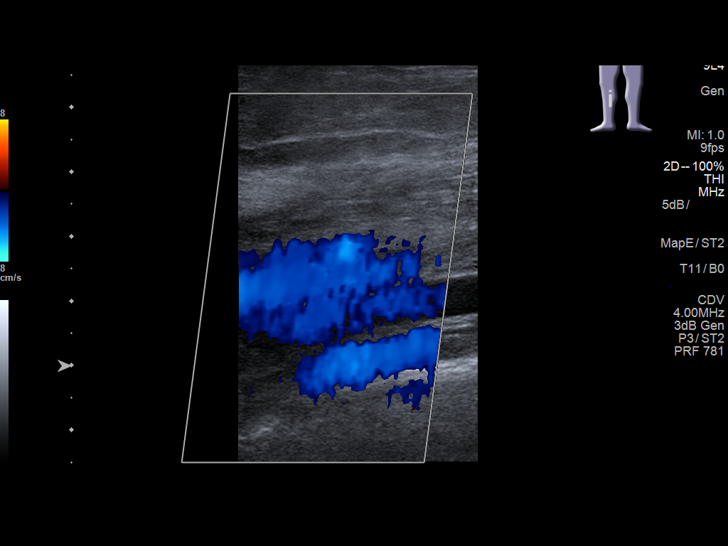
[im 35/39]
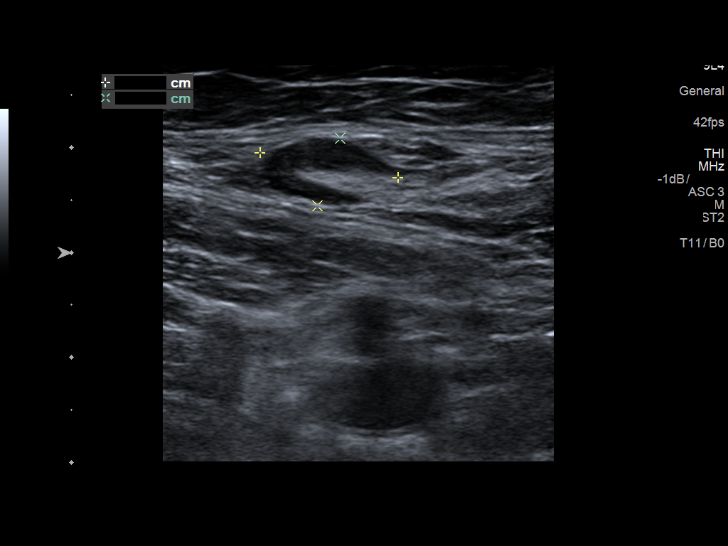
[im 39/39]
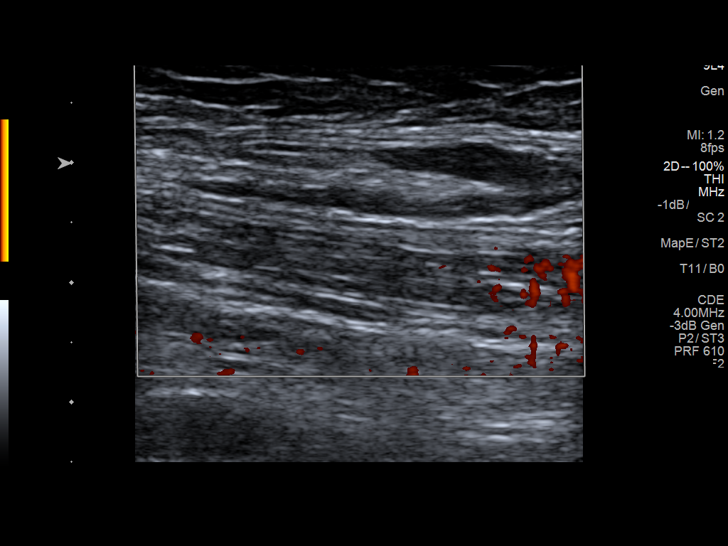

[13 of 24 positions shown; findings below may reference images not displayed]

FINDINGS: Contralateral Common Femoral Vein: Respiratory phasicity is normal
and symmetric with the symptomatic side. No evidence of thrombus.
Normal compressibility.

Common Femoral Vein: No evidence of thrombus. Normal
compressibility, respiratory phasicity and response to augmentation.

Saphenofemoral Junction: No evidence of thrombus. Normal
compressibility and flow on color Doppler imaging.

Profunda Femoral Vein: No evidence of thrombus. Normal
compressibility and flow on color Doppler imaging.

Femoral Vein: No evidence of thrombus. Normal compressibility,
respiratory phasicity and response to augmentation.

Popliteal Vein: No evidence of thrombus. Normal compressibility,
respiratory phasicity and response to augmentation.

Calf Veins: No evidence of thrombus. Normal compressibility and flow
on color Doppler imaging.

Superficial Great Saphenous Vein: No evidence of thrombus. Normal
compressibility.

Venous Reflux:  None.

Other Findings:  None.
IMPRESSION: No evidence of deep venous thrombosis in right lower extremity.

## 2019-03-12 ENCOUNTER — Telehealth: Payer: Self-pay | Admitting: Neurology

## 2019-03-12 NOTE — Telephone Encounter (Signed)
OK for e-visit, she will need MOCA prior to visit.

## 2019-03-12 NOTE — Telephone Encounter (Signed)
Patient's daughter called regarding her mom's memory seeming to be getting worse. She was seen last in 2018 and no showed for her appt in 2019. She is wanting to know if her mom should have an E Visit? Please Advise. Thanks

## 2019-03-17 ENCOUNTER — Other Ambulatory Visit: Payer: Self-pay

## 2019-03-17 ENCOUNTER — Encounter: Payer: Self-pay | Admitting: Neurology

## 2019-03-17 ENCOUNTER — Telehealth (INDEPENDENT_AMBULATORY_CARE_PROVIDER_SITE_OTHER): Payer: Medicare HMO | Admitting: Neurology

## 2019-03-17 VITALS — BP 105/78 | HR 80 | Ht 67.0 in | Wt 184.2 lb

## 2019-03-17 DIAGNOSIS — F0390 Unspecified dementia without behavioral disturbance: Secondary | ICD-10-CM

## 2019-03-17 DIAGNOSIS — F03A0 Unspecified dementia, mild, without behavioral disturbance, psychotic disturbance, mood disturbance, and anxiety: Secondary | ICD-10-CM

## 2019-03-17 MED ORDER — MEMANTINE HCL 10 MG PO TABS: ORAL_TABLET | ORAL | 11 refills | Status: DC

## 2019-03-17 NOTE — Progress Notes (Signed)
Virtual Visit via Video Note The purpose of this virtual visit is to provide medical care while limiting exposure to the novel coronavirus.    Consent was obtained for video visit:  Yes.   Answered questions that patient had about telehealth interaction:  Yes.   I discussed the limitations, risks, security and privacy concerns of performing an evaluation and management service by telemedicine. I also discussed with the patient that there may be a patient responsible charge related to this service. The patient expressed understanding and agreed to proceed.  Pt location: Home Physician Location: office Name of referring provider:  Donnie Coffin, MD I connected with Maria Mays at patients initiation/request on 03/17/2019 at 11:30 AM EDT by video enabled telemedicine application and verified that I am speaking with the correct person using two identifiers. Pt MRN:  [id number] Pt DOB:  22-Dec-1945 Video Participants:  Dierdre Forth;  Daughters Mateo Flow and [place]   History of Present Illness:  The patient was last seen in August 2018 for worsening memory. Her 2 daughters are again present to provide additional history during this e-visit. Her MOCA score was 26/30 in August 2018. Her MRI brain in 08/2017 did not show any acute changes. Since her initial visit in 2018, family reports that memory has been progressively worsening. Her daughters took over medications almost 2 years ago, she was forgetting when she took them. Even when daughters give her medications now, she states she already took them. Her husband manages finances. She does not drive. She forgets if she has eaten already. She has difficulty figuring out the TV remote control. She is independent with dressing and bathing. Her daughters have noticed some personality changes, she is a little more paranoid (has baseline paranoia) and a little more anxious. She has always been a light sleeper but is up a lot at night afraid she  will miss something, wandering all over the house. She naps during the day. No hallucinations. She states her memory is "not great," she forgets details. She states things have changed since I last saw her, she has retired since then, daughters remind her she had already retired prior to her initial visit. She denies any headaches, dizziness, vision changes, focal numbness/tingling/weakness, no falls. She has had daily diarrhea since surgery for colon cancer.  History on Initial Assessment 06/2017: This is a 73 year old right-handed woman with a history of breast cancer, colon cancer, hypothyroidism, presenting for evaluation of worsening memory. When asked about her memory, she states "I can notice a little problem sometimes." She lives with her husband and son. Her 2 daughters reports memory changes started after her surgeries in 2011/2012, she cannot remember within a conversation what they were talking about. She repeats herself. She misplaces thing frequently. She used to be in charge of bills but when they started doing online banking, her husband took over. She stopped driving a year ago, family requested her to stop, she denied getting lost driving. She occasionally forgets her medications. Her daughters report she is more paranoid now than she used to be, she would think people walking outside would come in and steal from them. No hallucinations. 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 No difficulties with ADLs. She has trouble letting go and can focus on things "worrying herself to death." They report she has always been this way but is has worsened.  She has  occasional sinus/allergy headaches. She denies any dizziness, diplopia, dysarthria/dysphagia, neck pain, focal numbness/tingling/weakness, bowel/bladder dysfunction, anosmia, tremors. She had occasional back pain. She fell a couple of weeks ago and has a right  boot s/p surgery for hammertoe/bunion. Her mother had memory issues. She denies any history of significant head injury, no alcohol use.   Observations/Objective:   Vitals:   03/17/19 1137  BP: 105/78  Pulse: 80  Weight: 184 lb 3.2 oz (83.6 kg)  Height: 5\' 7"  (1.702 m)   Patient is awake, alert, oriented to person, place. No aphasia or dysarthria. Decreased fluency. Intact comprehension. Remote and recent memory impaired. Able to repeat. Cranial nerves: Extraocular movements intact with no nystagmus. No facial asymmetry. Motor: moves all extremities symmetrically. No incoordination on finger to nose testing. Gait: narrow-based and steady, able to tandem walk adequately. Negative Romberg test.  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 She was last seen in 2018 with progression of symptoms. MOCA (blind) score today 14/22 (26/30 in 2018). She is having more difficulty with managing complex tasks. We discussed the diagnosis of mild dementia, likely Alzheimer's type. MRI brain in 2018 did not show any acute changes. We discussed medications used in dementia, including side effects and expectations. Due to daily diarrhea, would avoid Donepezil. Start Memantine 10mg  qhs x 2 weeks, then increase to 10mg  BID. We discussed the importance of home safety and management of behavioral changes. Informational resources will be mailed to them. She does not drive.  Follow-up in 6 months, they know to call for any changes.    Follow Up Instructions:   -I discussed the assessment and treatment plan with the patient/family. The patient/family were provided an opportunity to ask questions and all were answered. The patient/family agreed with the plan and demonstrated an understanding of the instructions.   The patient/family were advised to call back or seek an in-person evaluation if the symptoms worsen or if the condition fails to improve as anticipated.    Cameron Sprang, MD

## 2019-04-22 ENCOUNTER — Telehealth: Payer: Self-pay | Admitting: Neurology

## 2019-04-22 NOTE — Telephone Encounter (Signed)
Pharmacy called needing to speak with the nurse regarding needing Dr. Amparo [place] DEA #? Please Call. Thanks

## 2019-08-05 ENCOUNTER — Telehealth: Payer: Self-pay | Admitting: Neurology

## 2019-08-05 NOTE — Telephone Encounter (Signed)
Daughter has some questions about the medication we out her on for her Dementia. She states that it is to help slow down the dementia and wants to know how will she know if it is working

## 2019-08-06 NOTE — Telephone Encounter (Signed)
Daughter does not believe Maria Mays is helping. She would like for pt to stop medication. The pt continues to forget where she is and still believes she is working. 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 Daughter did not seem like she wanted pt to be on any medication at all.

## 2019-08-07 NOTE — Telephone Encounter (Signed)
For Maria Mays to review

## 2019-08-10 NOTE — Telephone Encounter (Signed)
Pls let her know that the medications for dementia are not a cure in any way, it's not like she takes the medication and would start remembering better. It can help slow down worsening, and it may not be something we can obviously see, it is more of a stabilizing agents. Some patients would have faster worsening off the medication. But since it is not a cure, if they would like to stop the medication, that is fine. Thanks

## 2019-08-11 NOTE — Telephone Encounter (Signed)
Called daughter [name] and relayed all that Dr. Delice Lesch stated (note below) regarding it being okay to stop the medication.   Daughter said mom started to take med in May and hasn't seen it stabilize at all. Patient is worried about how to get to work (hasn't worked in years). Very confused/disoriented when she wakes up.   Daughter is trying different things to stimulate her mom's brain such as cards or adult coloring book.   Daughter stated she will discuss 12/11 with MD if there is an option for another medication. Doesn't want to have any other med prior to then.  Dr. Delice Lesch - daughter stated it is hard to get her mom out of the house for appts. - if evists are still an option in Dec would it be okay for them to change appt from in person to virtual with you?

## 2019-08-11 NOTE — Telephone Encounter (Signed)
Made note on appt notes that ok for evisit per MD if still an option. Daughter will call week prior if they want to do this / have the need.

## 2019-08-11 NOTE — Telephone Encounter (Signed)
[place] for e-visit in Dec if [facility] is still allowing for it. Thanks

## 2019-10-29 ENCOUNTER — Encounter: Payer: Self-pay | Admitting: Neurology

## 2019-10-30 ENCOUNTER — Telehealth (INDEPENDENT_AMBULATORY_CARE_PROVIDER_SITE_OTHER): Payer: Medicare HMO | Admitting: Neurology

## 2019-10-30 ENCOUNTER — Other Ambulatory Visit: Payer: Self-pay

## 2019-10-30 VITALS — Ht 67.0 in | Wt 189.0 lb

## 2019-10-30 DIAGNOSIS — F03B18 Unspecified dementia, moderate, with other behavioral disturbance: Secondary | ICD-10-CM

## 2019-10-30 DIAGNOSIS — F0391 Unspecified dementia with behavioral disturbance: Secondary | ICD-10-CM | POA: Diagnosis not present

## 2019-10-30 NOTE — Progress Notes (Signed)
Virtual Visit via Video Note The purpose of this virtual visit is to provide medical care while limiting exposure to the novel coronavirus.    Consent was obtained for video visit:  Yes.   Answered questions that patient had about telehealth interaction:  Yes.   I discussed the limitations, risks, security and privacy concerns of performing an evaluation and management service by telemedicine. I also discussed with the patient that there may be a patient responsible charge related to this service. The patient expressed understanding and agreed to proceed.  Pt location: Home Physician Location: office Name of referring provider:  Donnie Coffin, MD I connected with Talecia Greunke Rothenberger at patients initiation/request on 10/30/2019 at 12:30 PM EST by video enabled telemedicine application and verified that I am speaking with the correct person using two identifiers. Pt MRN:  [id number] Pt DOB:  Jun 16, 1946 Video Participants:  Gorden Harms Perazzo;  [name] (daughter)   History of Present Illness:  The patient was seen as a virtual video visit on 10/30/2019. She was last seen 8 months ago for dementia. Her daughter [name] is present during the e-visit to provide additional information. MOCA blind score 14/22 in April 2020. She was started on Memantine in April, however her daughter called 3 months ago reporting that it was not helping because she continued to forget where she is and believe she is still working. Since then, she has good and bad days. When she takes a nap, her whole memory resets. She wakes up from her nap thinking she was still working. There is some paranoia thinking someone will come in the front door. She has been living with [name] for the past 2 years, [name] manages her meals and medications. Her other daughter manages finances. She is independent with dressing and bathing. She does not sleep well, staying up late and sleeping late. [name] gives her Benadryl to  sleep. Melatonin did not help. She naps several times during the day. She has occasional headaches attributed to sinuses, taking Tylenol as needed. She denies any dizziness, vision changes, falls. She has daily diarrhea.  History on Initial Assessment 06/2017: This is a 73 year old right-handed woman with a history of breast cancer, colon cancer, hypothyroidism, presenting for evaluation of worsening memory. When asked about her memory, she states "I can notice a little problem sometimes." She lives with her husband and son. Her 2 daughters reports memory changes started after her surgeries in 2011/2012, she cannot remember within a conversation what they were talking about. She repeats herself. She misplaces thing frequently. She used to be in charge of bills but when they started doing online banking, her husband took over. She stopped driving a year ago, family requested her to stop, she denied getting lost driving. She occasionally forgets her medications. Her daughters report she is more paranoid now than she used to be, she would think people walking outside would come in and steal from them. No hallucinations. 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 No difficulties with ADLs. She has trouble letting go and can focus on things "worrying herself to death." They report she has always been this way but is has worsened.  She has occasional sinus/allergy headaches. She denies any dizziness, diplopia, dysarthria/dysphagia, neck pain, focal numbness/tingling/weakness, bowel/bladder dysfunction, anosmia, tremors. She had occasional back pain. She fell a couple of weeks ago and has a right boot s/p surgery  for hammertoe/bunion. Her mother had memory issues. She denies any history of significant head injury, no alcohol use.      Current Outpatient Medications on File Prior to Visit  Medication Sig Dispense Refill  .  albuterol (PROVENTIL) (2.5 MG/3ML) 0.083% nebulizer solution Take 2.5 mg by nebulization as needed for wheezing or shortness of breath.    . calcium-vitamin D (OSCAL WITH D) 250-125 MG-UNIT tablet Take 1 tablet by mouth 2 (two) times daily.     . diphenoxylate-atropine (LOMOTIL) 2.5-0.025 MG tablet Take 1 tablet by mouth 2 (two) times daily.     Marland Kitchen levothyroxine (SYNTHROID, LEVOTHROID) 88 MCG tablet     . Loratadine (CLARITIN PO) Take by mouth daily.    Marland Kitchen PARoxetine (PAXIL) 20 MG tablet Take 20 mg by mouth at bedtime.     . traMADol (ULTRAM) 50 MG tablet Take 1 tablet (50 mg total) by mouth every 6 (six) hours as needed. 30 tablet 0   No current facility-administered medications on file prior to visit.     Observations/Objective:   Vitals:   10/29/19 1414  Weight: 189 lb (85.7 kg)  Height: 5\' 7"  (1.702 m)   GEN:  The patient appears stated age and is in NAD.  Neurological examination: Patient is awake, alert, oriented x 3. No aphasia or dysarthria. Intact fluency and comprehension. Remote and recent memory impaired. SLUMS score 18/30. Cranial nerves: Extraocular movements intact with no nystagmus. No facial asymmetry. Motor: moves all extremities symmetrically, at least anti-gravity x 4. No incoordination on finger to nose testing.   [employer] Mental Exam 10/29/2019  Weekday Correct 1  Current year 1  What state are we in? 1  Amount spent 1  Amount left 0  # of Animals 1  5 objects recall 0  Number series 1  Hour markers 2  Time correct 2  Placed X in triangle correctly 1  Largest Figure 1  Name of female 2  Date back to work 0  Type of work 2  State she lived in 2  Total score 18    Assessment and Plan:   This is a 73 yo RH woman with a history of breast cancer, colon cancer, hypothyroidism, with moderate dementia, likely Alzheimer's disease. MRI brain in 2018 did not show any acute changes. SLUMS score today 18/30. Her daughter had previously called reporting  Memantine was not helping, expectations from dementia medications were discussed today. She is agreeable to restarting Memantine 10mg  at bedtime x 2 weeks, then increase to 1 tab BID. She now have 24/7 supervision at her daughter's house and does not drive. Follow-up in 6 months, they know to call for any changes.     Follow Up Instructions:   -I discussed the assessment and treatment plan with the patient/daughter. The patient/daughter were provided an opportunity to ask questions and all were answered. The patient/daughter agreed with the plan and demonstrated an understanding of the instructions.   The patient/daughter were advised to call back or seek an in-person evaluation if the symptoms worsen or if the condition fails to improve as anticipated.      Cameron Sprang, MD

## 2019-11-02 ENCOUNTER — Other Ambulatory Visit: Payer: Self-pay

## 2019-11-02 ENCOUNTER — Telehealth: Payer: Self-pay | Admitting: Neurology

## 2019-11-02 MED ORDER — MEMANTINE HCL 10 MG PO TABS: 10.0000 mg | ORAL_TABLET | Freq: Every day | ORAL | 3 refills | Status: DC

## 2019-11-02 NOTE — Telephone Encounter (Signed)
Daughter left msg with after hours about not having the medication sent to the pharm. There was no other info given. Thanks!

## 2019-11-02 NOTE — Telephone Encounter (Signed)
Memantine 10mg  take qhs for 2 weeks then BID thereafter. #90 with 1 refill sent to [facility].

## 2019-11-10 ENCOUNTER — Telehealth: Payer: Self-pay | Admitting: Neurology

## 2019-11-10 ENCOUNTER — Other Ambulatory Visit: Payer: Self-pay

## 2019-11-10 MED ORDER — TRAZODONE HCL 50 MG PO TABS: 50.0000 mg | ORAL_TABLET | Freq: Every day | ORAL | 3 refills | Status: DC

## 2019-11-10 NOTE — Telephone Encounter (Signed)
If daughter is agreeable to starting low dose Trazodone to help her sleep, main side effect is drowsiness. Start Trazodone 50mg  1/2 tab at bedtime x 1 week, then increase to 1 tablet every night. Recommend trying to keep her up with activities during the day if they are able, so she would be tired at night as well. Thanks

## 2019-11-10 NOTE — Telephone Encounter (Signed)
Up most of the night then sleeps during the day. Started about 2 weeks ago. No changes other than setting up a new reading corner for her.  Figits a lot, can't stay focused. Wonders the house during the day. Can fall asleep if she sits down to read or watch or movie during the day.  Daughter confirms that pt is not taking the Namenda because it caused aggitation. All other meds are correct.

## 2019-11-10 NOTE — Telephone Encounter (Signed)
Trazodone sent to pharmacy. Daughter given instructions.

## 2019-11-10 NOTE — Telephone Encounter (Signed)
Pt daughter is wanting to speak to someone about the medication that will help her mother sleep. Please call

## 2020-03-31 ENCOUNTER — Telehealth: Payer: Self-pay | Admitting: Neurology

## 2020-03-31 ENCOUNTER — Other Ambulatory Visit: Payer: Self-pay | Admitting: Neurology

## 2020-03-31 MED ORDER — TRAZODONE HCL 100 MG PO TABS: 100.0000 mg | ORAL_TABLET | Freq: Every day | ORAL | 11 refills | Status: DC

## 2020-03-31 NOTE — Telephone Encounter (Signed)
Pls let daughter know I sent in a new prescription for higher dose Trazodone 100mg : take 1 tab qhs, thanks

## 2020-03-31 NOTE — Telephone Encounter (Signed)
Patients daughter calling to request prescription refill for Trazodone. Was also told by Dr. Amparo Bristol nurse that they could up the medication since the patient still wasn't sleeping and if the higher dose worked that they could change the prescription to accommodate the higher dosage needed. Patients daughter states that taking 2 pills seems to be working better for the patient. Please send to pharmacy at [facility].

## 2020-03-31 NOTE — Telephone Encounter (Signed)
Spoke with pt daughter [name], pt is taken 100mg  of trazodone at night and it is working for her, pt is sleeping at night.

## 2020-03-31 NOTE — Telephone Encounter (Signed)
Spoke to pt daughter [name] new script for  Trazodone 100mg : take 1 tab qhs sent to pharmacy. Pt daughter verbalized understanding

## 2020-05-18 ENCOUNTER — Encounter: Payer: Self-pay | Admitting: Neurology

## 2020-05-18 ENCOUNTER — Ambulatory Visit: Payer: Medicare HMO | Admitting: Neurology

## 2020-05-18 ENCOUNTER — Other Ambulatory Visit: Payer: Self-pay

## 2020-05-18 VITALS — BP 115/69 | HR 63 | Ht 67.0 in | Wt 203.4 lb

## 2020-05-18 DIAGNOSIS — F0391 Unspecified dementia with behavioral disturbance: Secondary | ICD-10-CM | POA: Diagnosis not present

## 2020-05-18 DIAGNOSIS — G4701 Insomnia due to medical condition: Secondary | ICD-10-CM | POA: Diagnosis not present

## 2020-05-18 DIAGNOSIS — F03B18 Unspecified dementia, moderate, with other behavioral disturbance: Secondary | ICD-10-CM

## 2020-05-18 MED ORDER — TRAZODONE HCL 150 MG PO TABS: 150.0000 mg | ORAL_TABLET | Freq: Every day | ORAL | 11 refills | Status: DC

## 2020-05-18 MED ORDER — DONEPEZIL HCL 5 MG PO TABS: ORAL_TABLET | ORAL | 11 refills | Status: DC

## 2020-05-18 NOTE — Patient Instructions (Addendum)
1. Increase Trazodone to 150mg  every night. Can take with melatonin  2. Start the Donepezil 5mg : Take 1/2 tablet daily for 2 weeks, then increase to 1 tablet daily  3. Continue close supervision  4. Follow-up in 6 months, call for any changes   FALL PRECAUTIONS: Be cautious when walking. Scan the area for obstacles that may increase the risk of trips and falls. When getting up in the mornings, sit up at the edge of the bed for a few minutes before getting out of bed. Consider elevating the bed at the head end to avoid drop of blood pressure when getting up. Walk always in a well-lit room (use night lights in the walls). Avoid area rugs or power cords from appliances in the middle of the walkways. Use a walker or a cane if necessary and consider physical therapy for balance exercise. Get your eyesight checked regularly.   HOME SAFETY: Consider the safety of the kitchen when operating appliances like stoves, microwave oven, and blender. Consider having supervision and share cooking responsibilities until no longer able to participate in those. Accidents with firearms and other hazards in the house should be identified and addressed as well.   ABILITY TO BE LEFT ALONE: If patient is unable to contact 911 operator, consider using LifeLine, or when the need is there, arrange for someone to stay with patients. Smoking is a fire hazard, consider supervision or cessation. Risk of wandering should be assessed by caregiver and if detected at any point, supervision and safe proof recommendations should be instituted.   RECOMMENDATIONS FOR ALL PATIENTS WITH MEMORY PROBLEMS: 1. Continue to exercise (Recommend 30 minutes of walking everyday, or 3 hours every week) 2. Increase social interactions - continue going to [place] and enjoy social gatherings with friends and family 3. Eat healthy, avoid fried foods and eat more fruits and vegetables 4. Maintain adequate blood pressure, blood sugar, and blood  cholesterol level. Reducing the risk of stroke and cardiovascular disease also helps promoting better memory. 5. Avoid stressful situations. Live a simple life and avoid aggravations. Organize your time and prepare for the next day in anticipation. 6. Sleep well, avoid any interruptions of sleep and avoid any distractions in the bedroom that may interfere with adequate sleep quality 7. Avoid sugar, avoid sweets as there is a strong link between excessive sugar intake, diabetes, and cognitive impairment The Mediterranean diet has been shown to help patients reduce the risk of progressive memory disorders and reduces cardiovascular risk. This includes eating fish, eat fruits and green leafy vegetables, nuts like almonds and hazelnuts, walnuts, and also use olive oil. Avoid fast foods and fried foods as much as possible. Avoid sweets and sugar as sugar use has been linked to worsening of memory function.  There is always a concern of gradual progression of memory problems. If this is the case, then we may need to adjust level of care according to patient needs. Support, both to the patient and caregiver, should then be put into place.

## 2020-05-18 NOTE — Progress Notes (Signed)
NEUROLOGY FOLLOW UP OFFICE NOTE  Maria Mays [id number] [birth year]  HISTORY OF PRESENT ILLNESS: I had the pleasure of seeing Maria Mays in follow-up in the neurology clinic on 05/18/2020.  The patient was last seen 6 months ago for dementia. Her daughter Maria Mays is again present to provide additional information. SLUMS score 18/30 in 10/2019. She was restarted on Memantine but stopped due to aggression. She has daily diarrhea so Aricept was not started. Her daughter called in 10/2019 to report insomnia, she fidgets a lot and cannot stay focus, wandering the house during the day. She falls asleep during the day. She was started on Trazodone, with initial good response to Trazodone 100mg  qhs, however Maria Mays reports that this has worsened again. She is still up most nights and sleep during the day. She gets into things she is not supposed to when they are all asleep. When she visits her other daughter, they are out in the garden busy during the day, she sleeps through the night. She feels her memory is fair. Maria Mays reports continued progression, the other day she did not realize she was at her home, despite knowing her address. She thinks Maria Mays lives with her some time and has an apartment somewhere else, which she does not. She still thinks she is still working. She does not drive. Maria Mays manages finances and medications. She feels disoriented at night, waking up disoriented a lot of times. Her husband has mentioned auditory hallucinations at night, hearing people outside when she is hearing herself talk. She has had a couple of falls in the past 6 months where she could not figure out how to pull herself up, no loss of consciousness. She is independent with dressing and bathing. She can operate a microwave. They do not give her the remote control because she loses it.   She is having more headaches, reporting this is common with allergies this time of the year. She has occasional  dizziness, something while lying in bed. Maria Mays reports dizziness from the Paxil and Trazodone. Her diarrhea is not as bad as it used to be, she still has accidents but Maria Mays only gives Imodium when she has a doctor's appointment.    History on Initial Assessment 06/2017: This is a 74 year old right-handed woman with a history of breast cancer, colon cancer, hypothyroidism, presenting for evaluation of worsening memory. When asked about her memory, she states "I can notice a little problem sometimes." She lives with her husband and son. Her 2 daughters reports memory changes started after her surgeries in 2011/2012, she cannot remember within a conversation what they were talking about. She repeats herself. She misplaces thing frequently. She used to be in charge of bills but when they started doing online banking, her husband took over. She stopped driving a year ago, family requested her to stop, she denied getting lost driving. She occasionally forgets her medications. Her daughters report she is more paranoid now than she used to be, she would think people walking outside would come in and steal from them. No hallucinations. 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 No difficulties with ADLs. She has trouble letting go and can focus on things "worrying herself to death." They report she has always been this way but is has worsened.  She has occasional sinus/allergy headaches. She denies any dizziness, diplopia, dysarthria/dysphagia, neck pain, focal numbness/tingling/weakness, bowel/bladder dysfunction, anosmia, tremors. She had  occasional back pain. She fell a couple of weeks ago and has a right boot s/p surgery for hammertoe/bunion. Her mother had memory issues. She denies any history of significant head injury, no alcohol use.    PAST MEDICAL HISTORY: Past Medical History:  Diagnosis Date   Breast cancer ([place])  2011   LT MASTECTOMY   Colon cancer ([place])    Depression    Seasonal allergies    Sinus complaint     MEDICATIONS: Current Outpatient Medications on File Prior to Visit  Medication Sig Dispense Refill   albuterol (PROVENTIL) (2.5 MG/3ML) 0.083% nebulizer solution Take 2.5 mg by nebulization as needed for wheezing or shortness of breath.     calcium-vitamin D (OSCAL WITH D) 250-125 MG-UNIT tablet Take 1 tablet by mouth 2 (two) times daily.      diphenoxylate-atropine (LOMOTIL) 2.5-0.025 MG tablet Take 1 tablet by mouth 2 (two) times daily.      levothyroxine (SYNTHROID) 100 MCG tablet Take 100 mcg by mouth daily.     Loratadine (CLARITIN PO) Take by mouth daily.     memantine (NAMENDA) 10 MG tablet Take 1 tablet (10 mg total) by mouth daily. Take 10mg  nightly for two weeks then increase to 10mg  twice daily thereafter 90 tablet 3   PARoxetine (PAXIL) 20 MG tablet Take 20 mg by mouth at bedtime.      traMADol (ULTRAM) 50 MG tablet Take 1 tablet (50 mg total) by mouth every 6 (six) hours as needed. 30 tablet 0   traZODone (DESYREL) 100 MG tablet Take 1 tablet (100 mg total) by mouth at bedtime. 30 tablet 11   No current facility-administered medications on file prior to visit.    ALLERGIES: Allergies  Allergen Reactions   Iodides Hives   Other Other (See Comments)    Other Reaction: Rash with tape Uncoded Allergy. Allergen: Shellfish, Other Reaction: Hives/wheezing   Iopamidol Hives   Wheat Bran    Codeine Rash and Other (See Comments)   Iodine Rash   Levaquin [Levofloxacin In D5w] Rash   Levofloxacin Rash   Penicillins Rash and Hives   Sulfa Antibiotics Rash and Hives    FAMILY HISTORY: Family History  Problem Relation Age of Onset   Breast cancer Mother 15   Colon cancer Paternal Grandmother     SOCIAL HISTORY: Social History   Socioeconomic History   Marital status: Married    Spouse name: Not on file   Number of children: Not on file    Years of education: Not on file   Highest education level: Not on file  Occupational History   Not on file  Tobacco Use   Smoking status: Never Smoker   Smokeless tobacco: Never Used  Vaping Use   Vaping Use: Never used  Substance and Sexual Activity   Alcohol use: No   Drug use: No   Sexual activity: Not on file  Other Topics Concern   Not on file  Social History Narrative   Live in 1 story home with her husband and youngest son   Has 4 adult children   High school graduate   Retired CSR from [employer]   Social Determinants of Radio broadcast assistant Strain:    Difficulty of Paying Living Expenses:   Food Insecurity:    Worried About Charity fundraiser in the Last Year:    Arboriculturist in the Last Year:   Transportation Needs:    Film/video editor (Medical):  Lack of Transportation (Non-Medical):   Physical Activity:    Days of Exercise per Week:    Minutes of Exercise per Session:   Stress:    Feeling of Stress :   Social Connections:    Frequency of Communication with Friends and Family:    Frequency of Social Gatherings with Friends and Family:    Attends Religious Services:    Active Member of Clubs or Organizations:    Attends Music therapist:    Marital Status:   Intimate Partner Violence:    Fear of Current or Ex-Partner:    Emotionally Abused:    Physically Abused:    Sexually Abused:     PHYSICAL EXAM: Vitals:   05/18/20 1134  BP: 115/69  Pulse: 63  SpO2: 96%   General: No acute distress Head:  Normocephalic/atraumatic Skin/Extremities: No rash, no edema Neurological Exam: alert and oriented to person, states she is in a doctor's office in [place], [place]. Did not know month, year is 2018. No aphasia or dysarthria. Fund of knowledge is reduced.  Recent and remote memory are impaired.  Attention and concentration are normal, 5/5 WORLD backward.   Cranial nerves: Pupils equal, round, reactive to  light.  Extraocular movements intact with no nystagmus. Visual fields full. No facial asymmetry. Motor: Bulk and tone normal, muscle strength 5/5 throughout with no pronator drift.  Finger to nose testing intact.  Gait slow and cautious reporting left knee pain   IMPRESSION: This is a 74 yo RH woman with a history of breast cancer, colon cancer, hypothyroidism, with moderate dementia with behavioral disturbance, likely Alzheimer's disease. MRI brain in 2018 did not show any acute changes. SLUMS score 18/30 in 10/2019. She had side effects on Memantine. They report diarrhea has been better, we agreed to start low dose Donepezil 5mg  1/2 tab daily for 2 weeks, then increase to 1 tab daily. Side effects and expectations from medication were discussed. She continues to have sleep difficulties, advised to keep active during the day and increase Trazodone to 150mg  qhs. May take with melatonin 3mg  qhs. Continue close supervision, she is needing more supervision at night with nighttime wandering, resources provided for help at home. She does not drive. Follow-up in 6 months, they know to call for any changes.   Thank you for allowing me to participate in her care.  Please do not hesitate to call for any questions or concerns.   Ellouise Newer, M.D.   CC: Dr. Clide Deutscher

## 2020-06-21 ENCOUNTER — Telehealth: Payer: Self-pay | Admitting: Neurology

## 2020-06-21 NOTE — Telephone Encounter (Signed)
Patients daughter states her mom started Donepezil 3 weeks ago. Since then her diarrhea has gotten worse and she has bad stomach pains. Daughter reports patients stomach appears bigger, she thinks it might be bloating. She is asking if she can stop the medication? Please call.

## 2020-06-21 NOTE — Telephone Encounter (Signed)
Spoke with pt daughter [name] to stop the Donepezil, call for an update in 2 weeks,

## 2020-06-21 NOTE — Telephone Encounter (Signed)
Pls have her stop the Donepezil, call for an update in 2 weeks. thanks

## 2020-12-16 ENCOUNTER — Encounter: Payer: Self-pay | Admitting: Neurology

## 2020-12-16 ENCOUNTER — Telehealth (INDEPENDENT_AMBULATORY_CARE_PROVIDER_SITE_OTHER): Payer: Medicare HMO | Admitting: Neurology

## 2020-12-16 ENCOUNTER — Other Ambulatory Visit: Payer: Self-pay

## 2020-12-16 DIAGNOSIS — G4701 Insomnia due to medical condition: Secondary | ICD-10-CM | POA: Diagnosis not present

## 2020-12-16 DIAGNOSIS — F03B18 Unspecified dementia, moderate, with other behavioral disturbance: Secondary | ICD-10-CM

## 2020-12-16 DIAGNOSIS — F0391 Unspecified dementia with behavioral disturbance: Secondary | ICD-10-CM | POA: Diagnosis not present

## 2020-12-16 NOTE — Progress Notes (Signed)
Virtual Visit via Video Note The purpose of this virtual visit is to provide medical care while limiting exposure to the novel coronavirus.    Consent was obtained for video visit:  Yes.   Answered questions that patient had about telehealth interaction:  Yes.   I discussed the limitations, risks, security and privacy concerns of performing an evaluation and management service by telemedicine. I also discussed with the patient that there may be a patient responsible charge related to this service. The patient expressed understanding and agreed to proceed.  Pt location: Home Physician Location: office Name of referring provider:  Donnie Coffin, MD I connected with Maria Mays at patients initiation/request on 12/16/2020 at  3:30 PM EST by video enabled telemedicine application and verified that I am speaking with the correct person using two identifiers. Pt MRN:  [id number] Pt DOB:  1946/06/19 Video Participants:  Maria Mays;  Maria Mays (daughter)   History of Present Illness:  The patient had a virtual video visit on 12/16/2020. She was last seen in the neurology clinic 7 months ago for dementia. Her daughter Maria Mays is again present to provide additional information. SLUMS score 18/30 in 10/2019. She had side effects on Memantine and Donepezil. Maria Mays reports that she still takes Imodium as needed, but since changing to gluten-free diet, diarrhea has been better. She mostly has incidents when she eats gluten. Maria Mays manages finances, medications, meals. She has gotten lost in the house and left the house one time, so they changed locks now needing a key to get out. She has still tried to get out. She is not sure whose house she lives in all the time. Maria Mays has labeled doors because she would get confused about whose room it is. Sleep is the main issue, the other day she was up for 24 hours, her husband stays up with her. She is on Trazodone 150mg  qhs. Maria Mays has  tried 3mg  melatonin a few times. When she is staying with her other daughter, sleep is apparently better. She has been having hallucinations the past 6 months, saying her mother and stepfather are there, or someone was calling her. Appetite is okay, she keeps trying to give her food to the cat or her husband. She has occasional headaches, sometimes allergy-related. She takes Benadryl sometimes. She has dizziness, Dramamine seems to help. She needs help with bathing, she is able to dress herself. She has stopped cooking, she put plastic in the oven one time. There have been 4 incidents in the past 6 months where she has slid off the couch and would crawl to her room, she would not want people to help her but she cannot seem to figure out how to get up. She does not like to use her walker. She has a lot of knee pain.    History on Initial Assessment 06/2017: This is a 75 year old right-handed woman with a history of breast cancer, colon cancer, hypothyroidism, presenting for evaluation of worsening memory. When asked about her memory, she states "I can notice a little problem sometimes." She lives with her husband and son. Her 2 daughters reports memory changes started after her surgeries in 2011/2012, she cannot remember within a conversation what they were talking about. She repeats herself. She misplaces thing frequently. She used to be in charge of bills but when they started doing online banking, her husband took over. She stopped driving a year ago, family requested her to stop, she denied getting lost driving.  She occasionally forgets her medications. Her daughters report she is more paranoid now than she used to be, she would think people walking outside would come in and steal from them. No hallucinations. 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 No difficulties with ADLs. She has trouble letting go and can focus on  things "worrying herself to death." They report she has always been this way but is has worsened.  She has occasional sinus/allergy headaches. She denies any dizziness, diplopia, dysarthria/dysphagia, neck pain, focal numbness/tingling/weakness, bowel/bladder dysfunction, anosmia, tremors. She had occasional back pain. She fell a couple of weeks ago and has a right boot s/p surgery for hammertoe/bunion. Her mother had memory issues. She denies any history of significant head injury, no alcohol use.     Current Outpatient Medications on File Prior to Visit  Medication Sig Dispense Refill  . albuterol (PROVENTIL) (2.5 MG/3ML) 0.083% nebulizer solution Take 2.5 mg by nebulization as needed for wheezing or shortness of breath.    . calcium-vitamin D (OSCAL WITH D) 250-125 MG-UNIT tablet Take 1 tablet by mouth 2 (two) times daily.     Marland Kitchen levothyroxine (SYNTHROID) 100 MCG tablet Take 100 mcg by mouth daily.    Marland Kitchen loperamide (IMODIUM) 2 MG capsule Take 2 mg by mouth as needed for diarrhea or loose stools.    . Multiple Vitamins-Minerals (VITAMIN D3 COMPLETE) TABS Take 1 tablet by mouth at bedtime.    Marland Kitchen PARoxetine (PAXIL) 20 MG tablet Take 20 mg by mouth at bedtime.     . traZODone (DESYREL) 150 MG tablet Take 1 tablet (150 mg total) by mouth at bedtime. 30 tablet 11  . diphenoxylate-atropine (LOMOTIL) 2.5-0.025 MG tablet Take 1 tablet by mouth 2 (two) times daily.  (Patient not taking: Reported on 12/16/2020)    . Loratadine (CLARITIN PO) Take by mouth daily. (Patient not taking: No sig reported)     No current facility-administered medications on file prior to visit.     Observations/Objective:   GEN:  The patient appears stated age and is in NAD.  Neurological examination: Patient is awake, alert, oriented to person, season, city/state. Year is 2020. No aphasia or dysarthria. Reduced fluency, able to follow commands. Cranial nerves: Extraocular movements intact with no nystagmus. No facial asymmetry.  Motor: moves all extremities symmetrically, at least anti-gravity x 4. No incoordination on finger to nose testing. Gait: narrow-based, no ataxia.   Assessment and Plan:   This is a 75 yo RH woman with a history of breast cancer, colon cancer, hypothyroidism, with moderate dementia with behavioral disturbance, likely Alzheimer's disease. MRI brain in 2018 did not show any acute changes. SLUMS score 18/30 in 10/2019. She had side effects on Donepezil and Memantine. No improvement with sleep on Trazodone 150mg  qhs. We can try mirtazapine, however Maria Mays would like to try higher dose of melatonin first (up to 10mg ). She sleeps better when she stays at her other daughter's home. Continue close supervision. Follow-up in 6-8 months, they know to call for any changes.    Follow Up Instructions:   -I discussed the assessment and treatment plan with the patient's daughter. The patient's daughter was provided an opportunity to ask questions and all were answered. The patient's daughter agreed with the plan and demonstrated an understanding of the instructions.   The patient's daughter was advised to call back or seek an in-person evaluation if the symptoms worsen or if  the condition fails to improve as anticipated.     Cameron Sprang, MD

## 2021-07-05 ENCOUNTER — Other Ambulatory Visit: Payer: Self-pay

## 2021-07-05 ENCOUNTER — Ambulatory Visit: Payer: Medicare HMO | Admitting: Podiatry

## 2021-07-05 ENCOUNTER — Encounter: Payer: Self-pay | Admitting: Podiatry

## 2021-07-05 ENCOUNTER — Ambulatory Visit: Payer: Medicare HMO

## 2021-07-05 DIAGNOSIS — M2042 Other hammer toe(s) (acquired), left foot: Secondary | ICD-10-CM

## 2021-07-05 DIAGNOSIS — B351 Tinea unguium: Secondary | ICD-10-CM | POA: Diagnosis not present

## 2021-07-05 DIAGNOSIS — M79676 Pain in unspecified toe(s): Secondary | ICD-10-CM

## 2021-07-05 DIAGNOSIS — D2372 Other benign neoplasm of skin of left lower limb, including hip: Secondary | ICD-10-CM

## 2021-07-05 DIAGNOSIS — M2041 Other hammer toe(s) (acquired), right foot: Secondary | ICD-10-CM

## 2021-07-05 NOTE — Progress Notes (Signed)
Subjective:  Patient ID: Maria Mays, female    DOB: 1946-03-23,  MRN: [id number] HPI Chief Complaint  Patient presents with   Nail Problem    Hallux nail left - long, thick, hit on something and had it bleeding a few days ago   Foot Pain    1st MPJ left - bunion deformity, callused plantarly   New Patient (Initial Visit)    Est pt 07/2018    75 y.o. female presents with the above complaint.   ROS: Denies fever chills nausea vomiting muscle aches pains calf pain back pain chest pain shortness of breath.  Past Medical History:  Diagnosis Date   Breast cancer ([place]) 2011   LT MASTECTOMY   Colon cancer ([place])    Depression    Seasonal allergies    Sinus complaint    Past Surgical History:  Procedure Laterality Date   BREAST SURGERY Left    colon sugery     cancer   GALLBLADDER SURGERY     HERNIA REPAIR     x2    MASTECTOMY Left 2011   BREAST CA   TUBAL LIGATION      Current Outpatient Medications:    albuterol (PROVENTIL) (2.5 MG/3ML) 0.083% nebulizer solution, Take 2.5 mg by nebulization as needed for wheezing or shortness of breath., Disp: , Rfl:    Calcium Carbonate-Vitamin D3 600-400 MG-UNIT TABS, Take 1 tablet by mouth 2 (two) times daily., Disp: , Rfl:    diphenoxylate-atropine (LOMOTIL) 2.5-0.025 MG tablet, Take 1 tablet by mouth 2 (two) times daily.  (Patient not taking: Reported on 12/16/2020), Disp: , Rfl:    levothyroxine (SYNTHROID) 100 MCG tablet, Take 100 mcg by mouth daily., Disp: , Rfl:    loperamide (IMODIUM) 2 MG capsule, Take 2 mg by mouth as needed for diarrhea or loose stools., Disp: , Rfl:    Loratadine (CLARITIN PO), Take by mouth daily. (Patient not taking: No sig reported), Disp: , Rfl:    Multiple Vitamins-Minerals (VITAMIN D3 COMPLETE) TABS, Take 1 tablet by mouth at bedtime., Disp: , Rfl:    PARoxetine (PAXIL) 20 MG tablet, Take 20 mg by mouth at bedtime. , Disp: , Rfl:    traZODone (DESYREL) 150 MG tablet, Take 1 tablet (150 mg total) by  mouth at bedtime., Disp: 30 tablet, Rfl: 11  Allergies  Allergen Reactions   Iodides Hives   Other Other (See Comments)    Other Reaction: Rash with tape Uncoded Allergy. Allergen: Shellfish, Other Reaction: Hives/wheezing   Iopamidol Hives   Wheat Bran    Codeine Rash and Other (See Comments)   Iodine Rash   Levaquin [Levofloxacin In D5w] Rash   Levofloxacin Rash   Penicillins Rash and Hives   Sulfa Antibiotics Rash and Hives   Review of Systems Objective:  There were no vitals filed for this visit.  General: Well developed, nourished, in no acute distress, alert and oriented x3   Dermatological: Skin is warm, dry and supple bilateral. Nails x 10 are well maintained; remaining integument appears unremarkable at this time. There are no open sores, no preulcerative lesions, no rash or signs of infection present.  She has a reactive callus to the plantar medial aspect of the first metatarsophalangeal joint secondary to a bunion deformity.  This appeared to be peeling off and there is some blood that has tried beneath it.  Once debrided does not demonstrate ulcerative lesion.  No purulence no malodor.  Hallux nail left is long thick yellow dystrophic clinically  mycotic no other signs of mycosis about the foot or nails.   Vascular: Dorsalis Pedis artery and Posterior Tibial artery pedal pulses are 2/4 bilateral with immedate capillary fill time. Pedal hair growth present. No varicosities and no lower extremity edema present bilateral.   Neruologic: Grossly intact via light touch bilateral. Vibratory intact via tuning fork bilateral. Protective threshold with Semmes Wienstein monofilament intact to all pedal sites bilateral. Patellar and Achilles deep tendon reflexes 2+ bilateral. No Babinski or clonus noted bilateral.   Musculoskeletal: No gross boney pedal deformities bilateral. No pain, crepitus, or limitation noted with foot and ankle range of motion bilateral. Muscular strength 5/5 in  all groups tested bilateral.  Gait: Unassisted, Nonantalgic.    Radiographs:  None taken  Assessment & Plan:   Assessment: Benign skin lesion first metatarsophalangeal joint left.  Hallux valgus left.  Painful hallux nail secondary to onychomycosis.  Plan: Discussed etiology pathology conservative surgical therapies.  Debrided reactive hyperkeratotic lesion and debrided nail.  Follow-up as needed     Maria Hester T. Advance, [place]

## 2021-07-11 ENCOUNTER — Telehealth: Payer: Medicare HMO | Admitting: Neurology

## 2021-07-11 ENCOUNTER — Other Ambulatory Visit: Payer: Self-pay

## 2021-07-11 ENCOUNTER — Telehealth (INDEPENDENT_AMBULATORY_CARE_PROVIDER_SITE_OTHER): Payer: Medicare HMO | Admitting: Physician Assistant

## 2021-07-11 DIAGNOSIS — F03B18 Unspecified dementia, moderate, with other behavioral disturbance: Secondary | ICD-10-CM

## 2021-07-11 DIAGNOSIS — G4701 Insomnia due to medical condition: Secondary | ICD-10-CM | POA: Diagnosis not present

## 2021-07-11 DIAGNOSIS — F0391 Unspecified dementia with behavioral disturbance: Secondary | ICD-10-CM

## 2021-07-11 MED ORDER — MIRTAZAPINE 7.5 MG PO TABS: 7.5000 mg | ORAL_TABLET | Freq: Every day | ORAL | 3 refills | Status: DC

## 2021-07-11 NOTE — Progress Notes (Signed)
Virtual Visit via Video Note The purpose of this virtual visit is to provide medical care while limiting exposure to the novel coronavirus.    Consent was obtained for video visit:  yes  Answered questions that patient had about telehealth interaction:  yes  I discussed the limitations, risks, security and privacy concerns of performing an evaluation and management service by telemedicine. I also discussed with the patient that there may be a patient responsible charge related to this service. The patient expressed understanding and agreed to proceed.  Pt location: Home Physician Location: office Name of referring provider:  Donnie Coffin, MD I connected with Maria Mays at patients initiation/request on 07/11/2021 at 11:00 AM EDT by video enabled telemedicine application and verified that I am speaking with the correct person using two identifiers. Pt MRN:  [id number] Pt DOB:  [birth year] Video Participants:  Maria Mays;  daughter Maria Mays    History of Present Illness:   The patient had a virtual video visit on 07/11/2021.  She was last seen via video visit on 12/16/2020.   SLUMS score 18/30 in 10/2019. Her daughter Maria Mays is again present to provide additional information.  She is no longer on memantine or donepezil due to diarrhea and other side effects.  She continues a gluten-free diet, with better control of her loose stools, and takes Imodium as needed.  [name] manages the finances, medications and meals.  She no longer drives.  Her daughter has changed the lock, which now makes it more difficult for her to wander off, although she tried without success.  The patient is not sure who his house he lives in, sometimes she thinks is at the [place] [facility], as the grandfather's house, or in [place].  She needs constant care by now.  She still insomniac, tried trazodone without success.  The patient has not been on the medicine for about 2 or 3 months, after they run out.   Daughter reports that she is not interested in this medication since it does not work.  She takes 5 mg melatonin.  When at her other daughter, apparently her sleep is better.  She has hallucinations, seeing her mother, stepfather, but she is not feeling frightened by their presence.  She also says that other strangers are in her room as well.  These events are more frequent now.  Her appetite is good, and she eats all the time, because she forgets that she ate before.  She has not had any headaches lately.  When she does, she takes Benadryl with good resolution.  At times, when she has dizziness, she takes Dramamine which helps.  She needs help with bathing, able to dress herself.  She no longer cooks.  She ambulates without falls, does not like to use her walker.  She has chronic knee pain.     History on Initial Assessment 06/2017: This is a 75 year old right-handed woman with a history of breast cancer, colon cancer, hypothyroidism, presenting for evaluation of worsening memory. When asked about her memory, she states "I can notice a little problem sometimes." She lives with her husband and son. Her 2 daughters reports memory changes started after her surgeries in 2011/2012, she cannot remember within a conversation what they were talking about. She repeats herself. She misplaces thing frequently. She used to be in charge of bills but when they started doing online banking, her husband took over. She stopped driving a year ago, family requested her to stop, she  denied getting lost driving. She occasionally forgets her medications. Her daughters report she is more paranoid now than she used to be, she would think people walking outside would come in and steal from them. No hallucinations. 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 No difficulties with ADLs. She has trouble letting go and can focus on things "worrying herself to  death." They report she has always been this way but is has worsened.   She has occasional sinus/allergy headaches. She denies any dizziness, diplopia, dysarthria/dysphagia, neck pain, focal numbness/tingling/weakness, bowel/bladder dysfunction, anosmia, tremors. She had occasional back pain. She fell a couple of weeks ago and has a right boot s/p surgery for hammertoe/bunion. Her mother had memory issues. She denies any history of significant head injury, no alcohol use.        Current Outpatient Medications on File Prior to Visit  Medication Sig Dispense Refill   albuterol (PROVENTIL) (2.5 MG/3ML) 0.083% nebulizer solution Take 2.5 mg by nebulization as needed for wheezing or shortness of breath.     Calcium Carbonate-Vitamin D3 600-400 MG-UNIT TABS Take 1 tablet by mouth 2 (two) times daily.     diphenoxylate-atropine (LOMOTIL) 2.5-0.025 MG tablet Take 1 tablet by mouth 2 (two) times daily.  (Patient not taking: Reported on 12/16/2020)     levothyroxine (SYNTHROID) 100 MCG tablet Take 100 mcg by mouth daily.     loperamide (IMODIUM) 2 MG capsule Take 2 mg by mouth as needed for diarrhea or loose stools.     Loratadine (CLARITIN PO) Take by mouth daily. (Patient not taking: No sig reported)     Multiple Vitamins-Minerals (VITAMIN D3 COMPLETE) TABS Take 1 tablet by mouth at bedtime.     PARoxetine (PAXIL) 20 MG tablet Take 20 mg by mouth at bedtime.      traZODone (DESYREL) 150 MG tablet Take 1 tablet (150 mg total) by mouth at bedtime. 30 tablet 11   No current facility-administered medications on file prior to visit.     Observations/Objective:   There were no vitals filed for this visit. GEN:  The patient appears stated age and is in NAD.  Neurological examination: Patient is awake, alert, oriented x 3. No aphasia or dysarthria. Intact fluency and comprehension. Remote and recent memory intact. Able to name and repeat. Cranial nerves: Extraocular movements intact with no nystagmus. No  facial asymmetry. Motor: moves all extremities symmetrically, at least anti-gravity x 4. No incoordination on finger to nose testing. Gait: narrow-based and steady, able to tandem walk adequately. Negative Romberg test.    Assessment and Plan:   This is a 75 yo RH woman with a history of breast cancer, colon cancer, hypothyroidism, with moderate dementia with behavioral disturbance, likely Alzheimer's disease. MRI brain in 2018 did not show any acute changes. SLUMS score 18/30 in 10/2019. She had side effects on Donepezil and Memantine. No improvement with sleep on Trazodone '150mg'$  qhs. We can try mirtazapine since Melatonin 5 mg does not provide relief. Daughter requests the lowest dose for now, 7.5 mg qhs  Continue close supervision, daughter is in the process of looking for sitters several times a week  Follow-up in 6-8 months, they know to call for any changes.     Follow Up Instructions:    -I discussed the assessment and treatment plan with the patient. The patient was provided an opportunity to ask questions and all were answered. The patient  agreed with the plan and demonstrated an understanding of the instructions.   The patient was advised to call back or seek an in-person evaluation if the symptoms worsen or if the condition fails to improve as anticipated.    Total time spent on today's visit was 30 minutes, including both face-to-face time and nonface-to-face time.  Time included that spent on review of records (prior notes available to me/labs/imaging if pertinent), discussing treatment and goals, answering patient's questions and coordinating care.   Sharene Butters, PA-C

## 2021-07-12 ENCOUNTER — Telehealth: Payer: Self-pay | Admitting: Neurology

## 2021-07-12 NOTE — Telephone Encounter (Signed)
Patient Daughter would like to speak to someone about getting home health care for patient,  She states that Dr Delice Lesch said that patient needed 24 hour care. She wants to know if Insurance will approve that and pay for that and how many hours they will pay for.   Please call

## 2021-07-13 NOTE — Telephone Encounter (Signed)
Patients family would like to have home health service. Please advise. Thanks

## 2021-07-17 ENCOUNTER — Telehealth: Payer: Self-pay

## 2021-07-17 NOTE — Telephone Encounter (Signed)
Spoke with daughter and she is going to contact her PCP, to see if we can get her more help.

## 2021-07-20 NOTE — Telephone Encounter (Signed)
Information provided, can close encounter.

## 2021-07-20 NOTE — Telephone Encounter (Signed)
Can we close this encounter, is she still needing our help or will contact PCP? Thanks

## 2021-10-05 ENCOUNTER — Encounter (INDEPENDENT_AMBULATORY_CARE_PROVIDER_SITE_OTHER): Payer: Self-pay

## 2021-10-05 ENCOUNTER — Encounter: Payer: Self-pay | Admitting: Podiatry

## 2021-10-05 ENCOUNTER — Ambulatory Visit: Payer: Medicare HMO | Admitting: Podiatry

## 2021-10-05 ENCOUNTER — Other Ambulatory Visit: Payer: Self-pay

## 2021-10-05 DIAGNOSIS — M79676 Pain in unspecified toe(s): Secondary | ICD-10-CM | POA: Diagnosis not present

## 2021-10-05 DIAGNOSIS — B351 Tinea unguium: Secondary | ICD-10-CM

## 2021-10-05 NOTE — Progress Notes (Signed)
This patient has thick painful big toenail left foot.  She says she has injured her toe.  She says it is painful walking and wearing her shoes.  Vascular  Dorsalis pedis and posterior tibial pulses are palpable  B/L.  Capillary return  WNL.  Temperature gradient is  WNL.  Skin turgor  WNL  Sensorium  Senn Weinstein monofilament wire  WNL. Normal tactile sensation.  Nail Exam  Patient has normal nails with no evidence of bacterial or fungal infection with the exception of long thick disfigured hallux nail left foot.  Orthopedic  Exam  Muscle tone and muscle strength  WNL.  No limitations of motion feet  B/L.  No crepitus or joint effusion noted.  Foot type is unremarkable and digits show no abnormalities. HAV  B/L.  Hammer toe second left foot.  Skin  No open lesions.  Normal skin texture and turgor.   Onychomycosis  left hallux  Debride nail with nail nipper and dremel tool.  RTC 3 months   Gardiner Barefoot DPM

## 2022-01-04 ENCOUNTER — Ambulatory Visit: Payer: Medicare HMO | Admitting: Podiatry

## 2022-01-25 ENCOUNTER — Encounter: Payer: Self-pay | Admitting: Podiatry

## 2022-01-25 ENCOUNTER — Ambulatory Visit: Payer: Medicare HMO | Admitting: Podiatry

## 2022-01-25 ENCOUNTER — Other Ambulatory Visit: Payer: Self-pay

## 2022-01-25 DIAGNOSIS — D2372 Other benign neoplasm of skin of left lower limb, including hip: Secondary | ICD-10-CM | POA: Diagnosis not present

## 2022-01-25 DIAGNOSIS — B351 Tinea unguium: Secondary | ICD-10-CM | POA: Diagnosis not present

## 2022-01-25 DIAGNOSIS — M79676 Pain in unspecified toe(s): Secondary | ICD-10-CM | POA: Diagnosis not present

## 2022-01-25 DIAGNOSIS — L84 Corns and callosities: Secondary | ICD-10-CM

## 2022-01-25 NOTE — Progress Notes (Signed)
This patient has thick painful big toenail left foot.  She says she has injured her toe.  She says it is painful walking and wearing her shoes.  She also has thick callus noted under her big toe joint left foot.  She presents to the office for evaluation and treatment.  She presents to the office with a caregiver. ? ?Vascular  Dorsalis pedis and posterior tibial pulses are palpable  B/L.  Capillary return  WNL.  Temperature gradient is  WNL.  Skin turgor  WNL ? ?Sensorium  Senn Weinstein monofilament wire  WNL. Normal tactile sensation. ? ?Nail Exam  Patient has normal nails with no evidence of bacterial or fungal infection with the exception of long thick disfigured hallux nail left foot. ? ?Orthopedic  Exam  Muscle tone and muscle strength  WNL.  No limitations of motion feet  B/L.  No crepitus or joint effusion noted.  Foot type is unremarkable and digits show no abnormalities. HAV left foot.   Hammer toe second left foot. ? ?Skin  No open lesions.  Normal skin texture and turgor. Callus sub 1st MPJ left foot. ? ?Onychomycosis  left hallux  Callus sub 1 left foot. ? ?Debride nail with nail nipper and dremel tool.  Debride callus with # 15 blade and dremel tool.RTC 3 months ? ? ?Gardiner Barefoot DPM  ?

## 2022-04-10 ENCOUNTER — Telehealth: Payer: Self-pay | Admitting: Student

## 2022-04-10 NOTE — Telephone Encounter (Signed)
Spoke with patient's daughter [name], regarding the Palliative referral/services and all questions were answered and I rec'd a verbal consent from her to begin Palliative services.  I have scheduled a [facility] for 04/17/22 @ 10:30 AM

## 2022-04-17 ENCOUNTER — Other Ambulatory Visit: Payer: Medicare HMO | Admitting: Student

## 2022-04-17 DIAGNOSIS — Z515 Encounter for palliative care: Secondary | ICD-10-CM

## 2022-04-17 DIAGNOSIS — F03B18 Unspecified dementia, moderate, with other behavioral disturbance: Secondary | ICD-10-CM

## 2022-04-17 DIAGNOSIS — G4700 Insomnia, unspecified: Secondary | ICD-10-CM

## 2022-04-17 DIAGNOSIS — F32A Depression, unspecified: Secondary | ICD-10-CM

## 2022-04-17 NOTE — Progress Notes (Signed)
[facility] Consult Note Telephone: [phone]  Fax: [phone]   Date of encounter: 04/17/22 11:05 AM PATIENT NAME: Maria Mays Maria Mays [id number]   [phone] (home)  DOB: September 26, 1946 MRN: [id number] PRIMARY CARE PROVIDER:    Donnie Coffin, MD,  [place] [address] [phone]  REFERRING PROVIDER:   Donnie Coffin, MD [place],  [place] [address] [phone]  RESPONSIBLE PARTY:    Contact Information     Name Relation Home Work Mobile   [place] Daughter [phone]     Lendell Caprice  [phone]        Due to the COVID-19 crisis, this visit was done via telemedicine from my office and it was initiated and consent by this patient and or family.  I connected with  Britnee Mcdevitt Klingler OR PROXY on 04/17/22 by a video enabled telemedicine application and verified that I am speaking with the correct person using two identifiers.   I discussed the limitations of evaluation and management by telemedicine. The patient expressed understanding and agreed to proceed.                                     ASSESSMENT AND PLAN / RECOMMENDATIONS:   Advance Care Planning/Goals of Care: Goals include to maximize quality of life and symptom management. Patient/health care surrogate gave his/her permission to discuss.Our advance care planning conversation included a discussion about:    The value and importance of advance care planning  Experiences with loved ones who have been seriously ill or have died  Exploration of personal, cultural or spiritual beliefs that might influence medical decisions  Exploration of goals of care in the event of a sudden injury or illness Introduced MOST form. CODE STATUS: DNR  Education provided on Palliative Medicine vs. Hospice services. Daughter would like for patient to remain in the home. Patient  is needing additional support in the home. Will monitor and assess for changes/declines. Will refer for hospice evaluation when she meets criteria. Will refer to palliative SW for assistance with long range planning.   Symptom Management/Plan:  Dementia with behavioral disturbance-patient with severe anxiety,also hallucinations. Daughter reports anxiety has improved some, patient much calmer since medication changes. Continue paroxetine 30 mg for anxiety and depression, hydroxyzine 1-2 tablets TID PRN. Daughter is encouraged to give 2 tablets of hydroxyzine when exhibiting severe anxiety. Reorient and redirect as needed. Monitor for cognitive and functional decline, weight loss. Patient to see neurology in June for her dementia.  Insomnia-continue hydroxyzine 2 tablets QHS PRN, encourage daughter to resume melatonin to see if this helps.   Follow up Palliative Care Visit: Palliative care will continue to follow for complex medical decision making, advance care planning, and clarification of goals. Return in 4 weeks or prn.   This visit was coded based on medical decision making (MDM).  PPS: 50%  HOSPICE ELIGIBILITY/DIAGNOSIS: TBD  Chief Complaint: Palliative Medicine initial consult.  HISTORY OF PRESENT ILLNESS:  KATHRYNNE Mays is a 76 y.o. 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  Patient currently resides at home with husband, daughter's family. She has increased anxiety, needing additional support in the home. Daughter states that anxiety has improved since  recent medication changes of increasing paroxetine and adding hydroxyzine. Patient denies pain, except today she endorses sore throat related to her allergies; no shortness of breath. Her appetite is usually good, but varies. She forgets she has eaten, she  snacks throughout the day. Weight is stable. She requires much cueing/redirection. Endorses weakness; daughter states she is falling every couple of weeks. She is having more episodes of bowel and bladder incontinence. Has upcoming appointment in June regarding her dementia.    History obtained from review of EMR, discussion with primary team, and interview with family, facility staff/caregiver and/or Ms. Beaudin.  I reviewed available labs, medications, imaging, studies and related documents from the EMR.  Records reviewed and summarized above.   ROS  General: NAD EYES: denies vision changes ENMT: denies dysphagia, sore throat Cardiovascular: denies chest pain, denies DOE Pulmonary:  denies increased SOB Abdomen: endorses good appetite, denies constipation, episodes of diarrhea GU: denies dysuria MSK: increased weakness Skin: denies rashes or wounds Neurological: denies pain, insomnia Psych: Endorses stable mood Heme/lymph/immuno: denies bruises, abnormal bleeding  Physical Exam:  Constitutional: NAD General: frail appearing EYES: anicteric sclera, lids intact, no discharge  ENMT: intact hearing hoarse voice CV: no LE edema Pulmonary: no increased work of breathing, no cough, room air Abdomen: no ascites GU: deferred MSK: moves all extremities, ambulatory Skin:  no rashes or wounds on visible skin Neuro: generalized weakness, A & O to person, familiars Psych: non-anxious affect, pleasant Hem/lymph/immuno: no widespread bruising CURRENT PROBLEM LIST:  Patient Active Problem List   Diagnosis Date Noted   Callus 01/25/2022   Moderate dementia with behavioral disturbance ([place]) 07/11/2021   Mild cognitive impairment 07/25/2017   History of cancer 07/25/2017   Displaced fracture of distal end of left radius 11/05/2014   MCI (mild cognitive impairment) with memory loss 09/08/2012   GERD (gastroesophageal reflux disease) 07/04/2012   Ventral hernia 07/04/2012   Advance  directive on file 07/01/2012   Asthma 07/01/2012   Colorectal cancer, stage III ([place]) 07/01/2012   DCIS (ductal carcinoma in situ) of breast 07/01/2012   Depression 07/01/2012   Hypothyroid 07/01/2012   Refusal of blood transfusions as patient is Jehovah's Witness 07/01/2012   PAST MEDICAL HISTORY:  Active Ambulatory Problems    Diagnosis Date Noted   Mild cognitive impairment 07/25/2017   History of cancer 07/25/2017   Advance directive on file 07/01/2012   Asthma 07/01/2012   Colorectal cancer, stage III ([place]) 07/01/2012   DCIS (ductal carcinoma in situ) of breast 07/01/2012   Depression 07/01/2012   GERD (gastroesophageal reflux disease) 07/04/2012   Displaced fracture of distal end of left radius 11/05/2014   Hypothyroid 07/01/2012   MCI (mild cognitive impairment) with memory loss 09/08/2012   Refusal of blood transfusions as patient is Jehovah's Witness 07/01/2012   Ventral hernia 07/04/2012   Moderate dementia with behavioral disturbance ([place]) 07/11/2021   Callus 01/25/2022   Resolved Ambulatory Problems    Diagnosis Date Noted   No Resolved Ambulatory Problems   Past Medical History:  Diagnosis Date   Breast cancer ([place]) 2011   Colon cancer ([place])    Seasonal allergies    Sinus complaint    SOCIAL HX:  Social History   Tobacco Use   Smoking status: Never   Smokeless tobacco: Never  Substance Use Topics   Alcohol use: No   FAMILY HX:  Family History  Problem Relation Age of Onset   Breast cancer Mother 50   Colon cancer Paternal Grandmother  ALLERGIES:  Allergies  Allergen Reactions   Iodides Hives   Other Other (See Comments)    Other Reaction: Rash with tape Uncoded Allergy. Allergen: Shellfish, Other Reaction: Hives/wheezing   Iopamidol Hives   Wheat Bran    Codeine Rash and Other (See Comments)   Iodine Rash   Levaquin [Levofloxacin In D5w] Rash   Levofloxacin Rash   Penicillins Rash and Hives   Sulfa Antibiotics Rash and Hives      PERTINENT MEDICATIONS:  Outpatient Encounter Medications as of 04/17/2022  Medication Sig   albuterol (PROVENTIL) (2.5 MG/3ML) 0.083% nebulizer solution Take 2.5 mg by nebulization as needed for wheezing or shortness of breath.   Calcium Carbonate-Vitamin D3 600-400 MG-UNIT TABS Take 1 tablet by mouth 2 (two) times daily.   diphenoxylate-atropine (LOMOTIL) 2.5-0.025 MG tablet Take 1 tablet by mouth 2 (two) times daily.   levothyroxine (SYNTHROID) 100 MCG tablet Take 100 mcg by mouth daily.   loperamide (IMODIUM) 2 MG capsule Take 2 mg by mouth as needed for diarrhea or loose stools.   Loratadine (CLARITIN PO) Take by mouth daily.   mirtazapine (REMERON) 7.5 MG tablet Take 1 tablet (7.5 mg total) by mouth at bedtime.   Multiple Vitamins-Minerals (VITAMIN D3 COMPLETE) TABS Take 1 tablet by mouth at bedtime.   PARoxetine (PAXIL) 20 MG tablet Take 20 mg by mouth at bedtime.    No facility-administered encounter medications on file as of 04/17/2022.   Thank you for the opportunity to participate in the care of Ms. Lybeck.  The palliative care team will continue to follow. Please call our office at [phone] if we can be of additional assistance.   Ezekiel Slocumb, NP   COVID-19 PATIENT SCREENING TOOL Asked and negative response unless otherwise noted:  Have you had symptoms of covid, tested positive or been in contact with someone with symptoms/positive test in the past 5-10 days? No

## 2022-04-18 ENCOUNTER — Other Ambulatory Visit: Payer: Medicare HMO

## 2022-04-18 DIAGNOSIS — Z789 Other specified health status: Secondary | ICD-10-CM

## 2022-04-18 NOTE — Progress Notes (Signed)
Palliative care SW outreached patient to complete telephonic visit.    Palliative care SW outreached patient to complete follow up telephonic call per [facility] NP - L.Rivers to assess patients needs. Patient's daughter, [name], Daughter is primary caregiver and attempting to manage all of patients needs.   Daughter endorses that patients dementia is worsening. Patients legs are becoming weaker causing more falls. Daughter provides ADL care support to include bathing, dressing and hygiene. Daughter shares that She, her husband, mother all live in the same home. Daughter shares that her brother lives with them, and helps minimally.   Appetite: patients appetite remains fair - good. Patient is gluten free. No significant weight changes noted.    Psychosocial assessment: completed. Caregiver fatigued expressed, daughter shared the need for additional caregiver support.  Daughter shared that she placed daughter on the list with  [employer], patient does not qualify for Medicaid, and patient does not qualify for MOW due to not meeting the guideline qualification. Patient does not have a long term care policy nor is there any VA connections.  Patient was approved and received respite hours for 7 hours a day once every so often. Daughter is open to receiving information on the PACE program.  Resources: SW emailed daughter the following information along with a PACE fact sheet.   [place]  The [place] Program is an application-based program that reimburses eligible family caregivers caring for individuals of any age for up to $500 in respite care services in a calendar year. Funding is limited, and applications are accepted only when funds are available.    Application/Apply: [email or web address].[email or web address]/     Project CARE (Caregiver Alternatives to Running on Empty)  Offering persons with dementia and their  caregivers counseling, care consultation, dementia-specific information, caregiver assessments, caregiver education, respite care, and connections to social support networks. Provided by [facility] Division of Aging and Adult Services (DAAS) HowDangerous.be   [place] Office  Phone: [phone] or [phone]  Physical Address: [address] [place], [place] [address]   Apply for a [employer]  https://www.helpforalzheimersfamilies.com/get-help/hilarity-for-charity/  Grant Eligibility  To be eligible for the [place], the caregiver(s) or loved one living with Alzheimer's & dementia must be professionally diagnosed with Alzheimer's or a related dementia and fit within the following criteria.  Currently living at home with Alzheimer's disease or related dementia.  Caregiver(s) is facing financial and emotional hardships due to the unique challenges of Alzheimer's or related Dementia.  Resides in the [place] or [place].  *Only one application per person will be accepted on a quarterly basis. [facility] program not available in [place] or other [place] territories.  Applications are reviewed on a monthly basis. After submitting the application you will be contacted via email within 60-90 days. All applicants will be notified of their status.  **MUST CREATE AN ACCOUNT THIER WEBSITE IN ORDER TO SUBMIT AN APPLICATION**    [facility]ementia [facility] of [facility]  Dementia navigation program  [phone]  NoExchanges.com.ee   Our professional staff can assist with:   Developing a better understanding of the disease   Discussing options for symptom management   Addressing the emotional impact of the disease   Connecting you with additional community resources   Assistance with planning and problem-solving   Local and personalized resources and  referrals   Assistance funding including respite funds  o [place]  https://docs.google.com/forms/d/e/1FAIpQLSdohjMruZWxeIVlvnSU4ZLKqjZ4zmlalaretG-MnoMBuxG8CQ/viewform  Partnerships for Health (Dementia Care)  Partnership[employer] for Health provides training classes and media designed to: promote best practice in dementia care; enhance the wellness of people with dementia and their caregivers; improve organizational leadership; increase public awareness and knowledge; teach practical, hands-on skills caregivers need to create meaningful relationships with those they care for throughout the lifespan.  [address]  [place], [place] [address]  RewardPremium.[email or web address]      Palliative care will continue to monitor and assist with long term care planning as needed.

## 2022-05-11 ENCOUNTER — Ambulatory Visit: Payer: Medicare HMO | Admitting: Physician Assistant

## 2022-05-11 ENCOUNTER — Encounter: Payer: Self-pay | Admitting: Physician Assistant

## 2022-05-11 VITALS — BP 103/66 | HR 59 | Resp 20 | Wt 177.0 lb

## 2022-05-11 DIAGNOSIS — G309 Alzheimer's disease, unspecified: Secondary | ICD-10-CM | POA: Diagnosis not present

## 2022-05-11 DIAGNOSIS — F0280 Dementia in other diseases classified elsewhere without behavioral disturbance: Secondary | ICD-10-CM | POA: Diagnosis not present

## 2022-05-14 ENCOUNTER — Other Ambulatory Visit: Payer: Medicare HMO | Admitting: Student

## 2022-05-14 DIAGNOSIS — F0284 Dementia in other diseases classified elsewhere, unspecified severity, with anxiety: Secondary | ICD-10-CM

## 2022-05-14 DIAGNOSIS — G4700 Insomnia, unspecified: Secondary | ICD-10-CM

## 2022-05-14 DIAGNOSIS — Z515 Encounter for palliative care: Secondary | ICD-10-CM

## 2022-05-17 ENCOUNTER — Ambulatory Visit: Payer: Medicare HMO | Admitting: Podiatry

## 2022-07-05 ENCOUNTER — Ambulatory Visit: Payer: Medicare HMO | Admitting: Podiatry

## 2022-11-14 ENCOUNTER — Encounter: Payer: Self-pay | Admitting: Physician Assistant

## 2022-11-14 ENCOUNTER — Ambulatory Visit: Payer: Medicare HMO | Admitting: Physician Assistant

## 2022-11-14 VITALS — BP 92/62 | HR 100 | Wt 179.0 lb

## 2022-11-14 DIAGNOSIS — R413 Other amnesia: Secondary | ICD-10-CM

## 2022-11-14 DIAGNOSIS — F0390 Unspecified dementia without behavioral disturbance: Secondary | ICD-10-CM | POA: Diagnosis not present

## 2022-11-14 NOTE — Progress Notes (Signed)
Assessment/Plan:    Dementia likely due to Alzheimer's disease with behavioral disturbance  Maria Mays is a very pleasant 76 y.o. RH female with a history of breast cancer, colon cancer, hypothyroidism seen today in follow up for memory loss. Patient is not on antidementia medication, as she had significant side effects on donepezil and memantine.  She is on Remeron 7.5 mg nightly and Paxil 20 mg daily as well as Vistaril for mood and sleep as per PCP.  She is on 24/7 for monitoring for safety.  Cognitive decline is noted by the family. In today's visit she is not conversant, al information is obtained by her family.    Follow up in 3  months. Continue 24/7 monitoring for safety. Recommend HHN  Recommend caregiver support group for granddaughter, as well as psychotherapy for caregiver distress  Continue palliative care BP is on the lower side, family informed, increase water intake.  Continue to monitor mood as per PCP    Subjective:    This patient is accompanied in the office by her daughter and granddaughter who supplements the history.  Previous records as well as any outside records available were reviewed prior to todays visit.  She was last seen on 05/11/2022.   Any changes in memory since last visit?. "A little worse". "What you see today is how she is all day, she does not really interact".  She does not remember the names of her family members . STM And LTM Are affected, She requires redirection and queuing.  repeats oneself?  Endorsed " I want to go home".  Disoriented when walking into a room?  She does not recognize her house as her home. She "is always packing, or has to" go to work". She confuses places frequently, for example, she cannot remember where the bathroom is and she has "accidents".  Leaving objects in unusual places?  "All the time, especially her son's belongings"  Wandering behavior?  Patient denies   Any personality changes since last visit?   Patient denies   Any worsening depression?: Denies  Hallucinations or paranoia?  'She sees her mother"-granddaughter says Seizures?   Patient denies    Any sleep changes?  She takes Remeron, which helps with the insomnia. Denies vivid dreams, REM behavior or sleepwalking   Sleep apnea? denies   Any hygiene concerns? Needs assistance because of deconditioning  Independent of bathing and dressing?  Endorsed  Does the patient needs help with medications? Daughter and granddaughter in charge  Who is in charge of the finances? Daughter and granddaughter  in charge    Any changes in appetite? She forgets that she ate and eats again. She finds the sugar and eats spoonfuls. She places sweets in her soup, etc. She does not like to drink water, her granddaughter started to give it to her warm, for her to tolerate it better     Patient have trouble swallowing?  denies   Does the patient cook?  No Any headaches?    denies   Chronic back pain  denies   Ambulates with difficulty?  She is weaker than before. She had a recent fall and she is healing, she is on a wheelchair for comfort. No LOC.  Any stroke symptoms? Denies  Any tremors? denies   Any anosmia?  denies   Any incontinence of urine?  Patient denies   Any bowel dysfunction?   Chronic intermittent diarrhea  Patient lives  with her granddaughter and son Does the patient drive?She  no longer drives      History on Initial Assessment 06/2017: This is a 76 year old right-handed woman with a history of breast cancer, colon cancer, hypothyroidism, presenting for evaluation of worsening memory. When asked about her memory, she states "I can notice a little problem sometimes." She lives with her husband and son. Her 2 daughters reports memory changes started after her surgeries in 2011/2012, she cannot remember within a conversation what they were talking about. She repeats herself. She misplaces thing frequently. She used to be in charge of bills but when  they started doing online banking, her husband took over. She stopped driving a year ago, family requested her to stop, she denied getting lost driving. She occasionally forgets her medications. Her daughters report she is more paranoid now than she used to be, she would think people walking outside would come in and steal from them. No hallucinations. She has always had trouble with hoarding, but their house is worse over the years, they are trying to get her into counseling. No difficulties with ADLs. She has trouble letting go and can focus on things "worrying herself to death." They report she has always been this way but is has worsened.  She has occasional sinus/allergy headaches. She denies any dizziness, diplopia, dysarthria/dysphagia, neck pain, focal numbness/tingling/weakness, bowel/bladder dysfunction, anosmia, tremors. She had occasional back pain. She fell a couple of weeks ago and has a right boot s/p surgery for hammertoe/bunion. Her mother had memory issues. She denies any history of significant head injury, no alcohol use.   PREVIOUS MEDICATIONS:   CURRENT MEDICATIONS:  Outpatient Encounter Medications as of 11/14/2022  Medication Sig   albuterol (PROVENTIL) (2.5 MG/3ML) 0.083% nebulizer solution Take 2.5 mg by nebulization as needed for wheezing or shortness of breath.   Calcium Carbonate-Vitamin D3 600-400 MG-UNIT TABS Take 1 tablet by mouth 2 (two) times daily.   diphenoxylate-atropine (LOMOTIL) 2.5-0.025 MG tablet Take 1 tablet by mouth 2 (two) times daily.   hydrOXYzine (VISTARIL) 25 MG capsule Take 25-50 mg by mouth 3 (three) times daily as needed.   levothyroxine (SYNTHROID) 100 MCG tablet Take 100 mcg by mouth daily.   loperamide (IMODIUM) 2 MG capsule Take 2 mg by mouth as needed for diarrhea or loose stools.   Loratadine (CLARITIN PO) Take by mouth daily.   mirtazapine (REMERON) 7.5 MG tablet Take 1 tablet (7.5 mg total) by mouth at bedtime.   Multiple Vitamins-Minerals  (VITAMIN D3 COMPLETE) TABS Take 1 tablet by mouth at bedtime.   PARoxetine (PAXIL) 20 MG tablet Take 20 mg by mouth at bedtime.    No facility-administered encounter medications on file as of 11/14/2022.        No data to display            03/17/2019    9:00 AM 07/19/2017    1:00 PM  [facility] Cognitive Assessment   Visuospatial/ Executive (0/5) 0 5  Naming (0/3) 0 3  Attention: Read list of digits (0/2) 2 2  Attention: Read list of letters (0/1) 1 1  Attention: Serial 7 subtraction starting at 100 (0/3) 3 3  Language: Repeat phrase (0/2) 2 2  Language : Fluency (0/1) 0 1  Abstraction (0/2) 2 2  Delayed Recall (0/5) 0 1  Orientation (0/6) 3 6  Total 13 26  Adjusted Score (based on education) 14     Objective:     PHYSICAL EXAMINATION:    VITALS:   Vitals:   11/14/22 1507 11/14/22  1518  BP: 92/62 92/62  Pulse: 100   SpO2: 97%   Weight: 179 lb (81.2 kg)     GEN:  The patient appears stated age and is in NAD. HEENT:  Normocephalic, atraumatic.   Neurological examination:  General: NAD, well-groomed, appears stated age. Orientation: The patient is alert. Not oriented to person, place or date  Cranial nerves: There is good facial symmetry.The speech is not fluent but clear. No aphasia or dysarthria. Fund of knowledge is reduced.  Recent and remote memory are impaired. Attention and concentration are reduced.  Unable to name objects and repeat phrases.  Hearing is intact to conversational tone.    Sensation: Sensation is intact to light touch throughout Motor: Strength is at least antigravity x4. DTR's 2/4 in UE/LE     Movement examination: Tone: There is normal tone in the UE/LE Abnormal movements:  no tremor.  No myoclonus.  No asterixis.   Coordination:  There is  decremation with RAM's. Abnormal finger to nose  Gait and Station: The patient is on a wheelchair, gait not tested    Thank you for allowing [place] the opportunity to participate in the care of this  nice patient. Please do not hesitate to contact us for any questions or concerns.   Total time spent on today's visit was 30 minutes dedicated to this patient today, preparing to see patient, examining the patient, ordering tests and/or medications and counseling the patient, documenting clinical information in the EHR or other health record, independently interpreting results and communicating results to the patient/family, discussing treatment and goals, answering patient's questions and coordinating care.  Cc:  Donnie Coffin, MD  Sharene Butters 11/14/2022 7:17 PM

## 2022-11-14 NOTE — Patient Instructions (Signed)
Continue the mood medications    3. Continue close supervision  4. Follow-up in 3 Months, call for any changes  It was a pleasure to see you today at our office.      Memory  decline, memory medications: Call our office [phone]   For psychiatric meds, mood meds: Please have your primary care physician manage these medications.   Counseling regarding caregiver distress, including caregiver depression, anxiety and issues regarding community resources, adult day care programs, adult living facilities, or memory care questions:   Feel free to contact [place], Social Worker at [phone]   For assessment of decision of mental capacity and competency:  Call Dr. Anthoney Harada, geriatric psychiatrist at [phone]  For guidance in geriatric dementia issues please call [facility] [phone]  For guidance regarding [facility] and if placement were needed at the facility, contact Arnell Asal, Social Worker tel: [phone]  If you have any severe symptoms of a stroke, or other severe issues such as confusion,severe chills or fever, etc call 911 or go to the ER as you may need to be evaluated further   Feel free to visit Facebook page " [email or web address]" for tips of how to care for people with memory problems.          RECOMMENDATIONS FOR ALL PATIENTS WITH MEMORY PROBLEMS: 1. Continue to exercise (Recommend 30 minutes of walking everyday, or 3 hours every week) 2. Increase social interactions - continue going to [place] and enjoy social gatherings with friends and family 3. Eat healthy, avoid fried foods and eat more fruits and vegetables 4. Maintain adequate blood pressure, blood sugar, and blood cholesterol level. Reducing the risk of stroke and cardiovascular disease also helps promoting better memory. 5. Avoid stressful situations. Live a simple life and avoid aggravations. Organize your time and prepare for the next day in  anticipation. 6. Sleep well, avoid any interruptions of sleep and avoid any distractions in the bedroom that may interfere with adequate sleep quality 7. Avoid sugar, avoid sweets as there is a strong link between excessive sugar intake, diabetes, and cognitive impairment We discussed the Mediterranean diet, which has been shown to help patients reduce the risk of progressive memory disorders and reduces cardiovascular risk. This includes eating fish, eat fruits and green leafy vegetables, nuts like almonds and hazelnuts, walnuts, and also use olive oil. Avoid fast foods and fried foods as much as possible. Avoid sweets and sugar as sugar use has been linked to worsening of memory function.  There is always a concern of gradual progression of memory problems. If this is the case, then we may need to adjust level of care according to patient needs. Support, both to the patient and caregiver, should then be put into place.    The Alzheimer's Association is here all day, every day for people facing Alzheimer's disease through our free 24/7 Helpline: [phone]. The Helpline provides reliable information and support to all those who need assistance, such as individuals living with memory loss, Alzheimer's or other dementia, caregivers, health care professionals and the public.  Our highly trained and knowledgeable staff can help you with: Understanding memory loss, dementia and Alzheimer's  Medications and other treatment options  General information about aging and brain health  Skills to provide quality care and to find the best care from professionals  Legal, financial and living-arrangement decisions Our Helpline also features: Confidential care consultation provided by master's level clinicians who can help with decision-making support, crisis  assistance and education on issues families face every day  Help in a caller's preferred language using our translation service that features more than 200  languages and dialects  Referrals to local community programs, services and ongoing support     FALL PRECAUTIONS: Be cautious when walking. Scan the area for obstacles that may increase the risk of trips and falls. When getting up in the mornings, sit up at the edge of the bed for a few minutes before getting out of bed. Consider elevating the bed at the head end to avoid drop of blood pressure when getting up. Walk always in a well-lit room (use night lights in the walls). Avoid area rugs or power cords from appliances in the middle of the walkways. Use a walker or a cane if necessary and consider physical therapy for balance exercise. Get your eyesight checked regularly.  FINANCIAL OVERSIGHT: Supervision, especially oversight when making financial decisions or transactions is also recommended.  HOME SAFETY: Consider the safety of the kitchen when operating appliances like stoves, microwave oven, and blender. Consider having supervision and share cooking responsibilities until no longer able to participate in those. Accidents with firearms and other hazards in the house should be identified and addressed as well.   ABILITY TO BE LEFT ALONE: If patient is unable to contact 911 operator, consider using LifeLine, or when the need is there, arrange for someone to stay with patients. Smoking is a fire hazard, consider supervision or cessation. Risk of wandering should be assessed by caregiver and if detected at any point, supervision and safe proof recommendations should be instituted.  MEDICATION SUPERVISION: Inability to self-administer medication needs to be constantly addressed. Implement a mechanism to ensure safe administration of the medications.   DRIVING: Regarding driving, in patients with progressive memory problems, driving will be impaired. We advise to have someone else do the driving if trouble finding directions or if minor accidents are reported. Independent driving assessment is  available to determine safety of driving.   If you are interested in the driving assessment, you can contact the following:  [employer] in [place]  [place] [phone] or [phone]      [place] refers to food and lifestyle choices that are based on the traditions of countries located on the [employer]. This way of eating has been shown to help prevent certain conditions and improve outcomes for people who have chronic diseases, like kidney disease and heart disease. What are tips for following this plan? Lifestyle  Cook and eat meals together with your family, when possible. Drink enough fluid to keep your urine clear or pale yellow. Be physically active every day. This includes: Aerobic exercise like running or swimming. Leisure activities like gardening, walking, or housework. Get 7-8 hours of sleep each night. If recommended by your health care provider, drink red wine in moderation. This means 1 glass a day for nonpregnant women and 2 glasses a day for men. A glass of wine equals 5 oz (150 mL). Reading food labels  Check the serving size of packaged foods. For foods such as rice and pasta, the serving size refers to the amount of cooked product, not dry. Check the total fat in packaged foods. Avoid foods that have saturated fat or trans fats. Check the ingredients list for added sugars, such as corn syrup. Shopping  At the grocery store, buy most of your food from  the areas near the walls of the store. This includes: Fresh fruits and vegetables (produce). Grains, beans, nuts, and seeds. Some of these may be available in unpackaged forms or large amounts (in bulk). Fresh seafood. Poultry and eggs. Low-fat dairy products. Buy whole ingredients instead of prepackaged foods. Buy fresh fruits and vegetables in-season  from local farmers markets. Buy frozen fruits and vegetables in resealable bags. If you do not have access to quality fresh seafood, buy precooked frozen shrimp or canned fish, such as tuna, salmon, or sardines. Buy small amounts of raw or cooked vegetables, salads, or olives from the deli or salad bar at your store. Stock your pantry so you always have certain foods on hand, such as olive oil, canned tuna, canned tomatoes, rice, pasta, and beans. Cooking  Cook foods with extra-virgin olive oil instead of using butter or other vegetable oils. Have meat as a side dish, and have vegetables or grains as your main dish. This means having meat in small portions or adding small amounts of meat to foods like pasta or stew. Use beans or vegetables instead of meat in common dishes like chili or lasagna. Experiment with different cooking methods. Try roasting or broiling vegetables instead of steaming or sauteing them. Add frozen vegetables to soups, stews, pasta, or rice. Add nuts or seeds for added healthy fat at each meal. You can add these to yogurt, salads, or vegetable dishes. Marinate fish or vegetables using olive oil, lemon juice, garlic, and fresh herbs. Meal planning  Plan to eat 1 vegetarian meal one day each week. Try to work up to 2 vegetarian meals, if possible. Eat seafood 2 or more times a week. Have healthy snacks readily available, such as: Vegetable sticks with hummus. Greek yogurt. Fruit and nut trail mix. Eat balanced meals throughout the week. This includes: Fruit: 2-3 servings a day Vegetables: 4-5 servings a day Low-fat dairy: 2 servings a day Fish, poultry, or lean meat: 1 serving a day Beans and legumes: 2 or more servings a week Nuts and seeds: 1-2 servings a day Whole grains: 6-8 servings a day Extra-virgin olive oil: 3-4 servings a day Limit red meat and sweets to only a few servings a month What are my food choices? Mediterranean diet Recommended Grains:  Whole-grain pasta. Brown rice. Bulgar wheat. Polenta. Couscous. Whole-wheat bread. [place]. Vegetables: Artichokes. Beets. Broccoli. Cabbage. Carrots. Eggplant. Green beans. Chard. Kale. Spinach. Onions. Leeks. Peas. Squash. Tomatoes. Peppers. Radishes. Fruits: Apples. Apricots. Avocado. Berries. Bananas. Cherries. Dates. Figs. Grapes. Lemons. Melon. Oranges. Peaches. Plums. Pomegranate. Meats and other protein foods: Beans. Almonds. Sunflower seeds. Pine nuts. Peanuts. [place]. Salmon. Scallops. Shrimp. [place]. Tilapia. Clams. Oysters. Eggs. Dairy: Low-fat milk. Cheese. Greek yogurt. Beverages: Water. Red wine. Herbal tea. Fats and oils: Extra virgin olive oil. Avocado oil. Grape seed oil. Sweets and desserts: [place] yogurt with honey. Baked apples. Poached pears. Trail mix. Seasoning and other foods: Basil. Cilantro. Coriander. Cumin. Mint. Parsley. Sage. Rosemary. Tarragon. Garlic. Oregano. Thyme. Pepper. Balsalmic vinegar. Tahini. Hummus. Tomato sauce. Olives. Mushrooms. Limit these Grains: Prepackaged pasta or rice dishes. Prepackaged cereal with added sugar. Vegetables: Deep fried potatoes (french fries). Fruits: Fruit canned in syrup. Meats and other protein foods: Beef. Pork. Lamb. Poultry with skin. Hot dogs. [place] Salines. Dairy: Ice cream. Sour cream. Whole milk. Beverages: Juice. Sugar-sweetened soft drinks. Beer. Liquor and spirits. Fats and oils: Butter. Canola oil. Vegetable oil. Beef fat (tallow). Lard. Sweets and desserts: Cookies. Cakes. Pies. Candy. Seasoning and other foods: Mayonnaise. Premade sauces  and marinades. The items listed may not be a complete list. Talk with your dietitian about what dietary choices are right for you. Summary The Mediterranean diet includes both food and lifestyle choices. Eat a variety of fresh fruits and vegetables, beans, nuts, seeds, and whole grains. Limit the amount of red meat and sweets that you eat. Talk with your health care provider about  whether it is safe for you to drink red wine in moderation. This means 1 glass a day for nonpregnant women and 2 glasses a day for men. A glass of wine equals 5 oz (150 mL). This information is not intended to replace advice given to you by your health care provider. Make sure you discuss any questions you have with your health care provider. Document Released: 06/28/2016 Document Revised: 07/31/2016 Document Reviewed: 06/28/2016 Elsevier Interactive Patient Education  2017 [employer].      There is always a concern of gradual progression of memory problems. If this is the case, then we may need to adjust level of care according to patient needs. Support, both to the patient and caregiver, should then be put into place.

## 2022-12-11 ENCOUNTER — Ambulatory Visit: Payer: Medicare HMO | Admitting: Physician Assistant

## 2023-01-10 ENCOUNTER — Emergency Department: Payer: Medicare HMO

## 2023-01-10 ENCOUNTER — Emergency Department
Admission: EM | Admit: 2023-01-10 | Discharge: 2023-01-10 | Disposition: A | Discharge status: Home / Self Care | Payer: Medicare HMO | Attending: Student in an Organized Health Care Education/Training Program | Admitting: Student in an Organized Health Care Education/Training Program

## 2023-01-10 DIAGNOSIS — F0390 Unspecified dementia without behavioral disturbance: Secondary | ICD-10-CM | POA: Insufficient documentation

## 2023-01-10 DIAGNOSIS — J45909 Unspecified asthma, uncomplicated: Secondary | ICD-10-CM | POA: Insufficient documentation

## 2023-01-10 DIAGNOSIS — T6591XA Toxic effect of unspecified substance, accidental (unintentional), initial encounter: Secondary | ICD-10-CM

## 2023-01-10 DIAGNOSIS — T550X1A Toxic effect of soaps, accidental (unintentional), initial encounter: Secondary | ICD-10-CM | POA: Insufficient documentation

## 2023-01-10 HISTORY — DX: Unspecified dementia, unspecified severity, without behavioral disturbance, psychotic disturbance, mood disturbance, and anxiety: F03.90

## 2023-01-10 LAB — COMPREHENSIVE METABOLIC PANEL
ALT: 15 U/L (ref 0–44)
AST: 27 U/L (ref 15–41)
Albumin: 4.8 g/dL (ref 3.5–5.0)
Alkaline Phosphatase: 110 U/L (ref 38–126)
Anion gap: 13 (ref 5–15)
BUN: 26 mg/dL — ABNORMAL HIGH (ref 8–23)
CO2: 26 mmol/L (ref 22–32)
Calcium: 9.9 mg/dL (ref 8.9–10.3)
Chloride: 99 mmol/L (ref 98–111)
Creatinine, Ser: 1.08 mg/dL — ABNORMAL HIGH (ref 0.44–1.00)
GFR, Estimated: 53 mL/min — ABNORMAL LOW (ref >60)
Glucose, Bld: 129 mg/dL — ABNORMAL HIGH (ref 70–99)
Potassium: 3.8 mmol/L (ref 3.5–5.1)
Sodium: 138 mmol/L (ref 135–145)
Total Bilirubin: 0.8 mg/dL (ref 0.3–1.2)
Total Protein: 8.8 g/dL — ABNORMAL HIGH (ref 6.5–8.1)

## 2023-01-10 LAB — CBC
HCT: 48.9 % — ABNORMAL HIGH (ref 36.0–46.0)
Hemoglobin: 16.2 g/dL — ABNORMAL HIGH (ref 12.0–15.0)
MCH: 30.1 pg (ref 26.0–34.0)
MCHC: 33.1 g/dL (ref 30.0–36.0)
MCV: 90.9 fL (ref 80.0–100.0)
Platelets: 213 10*3/uL (ref 150–400)
RBC: 5.38 MIL/uL — ABNORMAL HIGH (ref 3.87–5.11)
RDW: 12.6 % (ref 11.5–15.5)
WBC: 13.6 10*3/uL — ABNORMAL HIGH (ref 4.0–10.5)
nRBC: 0 % (ref 0.0–0.2)

## 2023-01-10 MED ORDER — ACETAMINOPHEN 325 MG PO TABS
650.0000 mg | ORAL_TABLET | Freq: Once | ORAL | Status: AC
  Administered 2023-01-10: 650 mg via ORAL
  Filled 2023-01-10: qty 2

## 2023-01-10 MED ORDER — IPRATROPIUM-ALBUTEROL 0.5-2.5 (3) MG/3ML IN SOLN
3.0000 mL | Freq: Once | RESPIRATORY_TRACT | Status: AC
  Administered 2023-01-10: 3 mL via RESPIRATORY_TRACT
  Filled 2023-01-10: qty 3

## 2023-01-10 MED ORDER — ALUM & MAG HYDROXIDE-SIMETH 200-200-20 MG/5ML PO SUSP
30.0000 mL | Freq: Once | ORAL | Status: AC
  Administered 2023-01-10: 30 mL via ORAL
  Filled 2023-01-10: qty 30

## 2023-01-10 NOTE — ED Triage Notes (Signed)
Pt daughter sts that pt has ingested dawn dish soap. Daughter sts that she uses the light blue EZ-squeeze Dawn Dish soap. Daughter sts that pt uses the liquid flavoring for her drinking water and thinks she got it confused with the soap. Daughter sts that she went to work today and found that she had soap in her cup when she returned home.

## 2023-01-10 NOTE — Discharge Instructions (Signed)
Albuterol has been sent to your pharmacy.  Be sure to drink plenty of fluids.  Return to the ER if you have any additional questions or concerns or symptoms return.

## 2023-01-10 NOTE — ED Provider Notes (Signed)
[facility] Provider Note    Event Date/Time   First MD Initiated Contact with Patient 01/10/23 1921     (approximate)   History   Ingestion   HPI  MAHDIA KOR is a 77 y.o. female with a history of dementia presents to the ER after she ingested water with Dawn soap around 11 AM.  Did have 1 episode of vomiting but has had some wheezing.  Has a history of asthma.  She does not feel short of breath.  This was accidental as the patient has typically used a flavoring that is blue for her water and accidentally mistake it today.  According to family did not suspect that she ingested much.  Had 1 episode of vomiting has not had any additional episodes.     Physical Exam   Triage Vital Signs: ED Triage Vitals  Enc Vitals Group     BP 01/10/23 1738 123/73     Pulse Rate 01/10/23 1738 73     Resp 01/10/23 1738 17     Temp 01/10/23 1738 97.7 F (36.5 C)     Temp Source 01/10/23 1738 Oral     SpO2 01/10/23 1738 90 %     Weight 01/10/23 1739 200 lb (90.7 kg)     Height --      Head Circumference --      Peak Flow --      Pain Score 01/10/23 1739 0     Pain Loc --      Pain Edu? --      Excl. in [place]? --     Most recent vital signs: Vitals:   01/10/23 1738  BP: 123/73  Pulse: 73  Resp: 17  Temp: 97.7 F (36.5 C)  SpO2: 90%     Constitutional: Alert  Eyes: Conjunctivae are normal.  Head: Atraumatic. Nose: No congestion/rhinnorhea. Mouth/Throat: Mucous membranes are moist.  No erythema no edema, uvula midline no stridor Neck: Painless ROM.  Cardiovascular:   Good peripheral circulation. Respiratory: Normal respiratory effort.  No retractions.  Occasional wheeze good air movement throughout no tachypnea. Gastrointestinal: Soft and nontender.  Musculoskeletal:  no deformity Neurologic:  MAE spontaneously. No gross focal neurologic deficits are appreciated.  Skin:  Skin is warm, dry and intact. No rash noted. Psychiatric: Mood and affect  are normal. Speech and behavior are normal.    ED Results / Procedures / Treatments   Labs (all labs ordered are listed, but only abnormal results are displayed) Labs Reviewed  COMPREHENSIVE METABOLIC PANEL - Abnormal; Notable for the following components:      Result Value   Glucose, Bld 129 (*)    BUN 26 (*)    Creatinine, Ser 1.08 (*)    Total Protein 8.8 (*)    GFR, Estimated 53 (*)    All other components within normal limits  CBC - Abnormal; Notable for the following components:   WBC 13.6 (*)    RBC 5.38 (*)    Hemoglobin 16.2 (*)    HCT 48.9 (*)    All other components within normal limits     EKG     RADIOLOGY Please see ED Course for my review and interpretation.  I personally reviewed all radiographic images ordered to evaluate for the above acute complaints and reviewed radiology reports and findings.  These findings were personally discussed with the patient.  Please see medical record for radiology report.    PROCEDURES:  Critical Care performed:  Procedures   MEDICATIONS ORDERED IN ED: Medications  ipratropium-albuterol (DUONEB) 0.5-2.5 (3) MG/3ML nebulizer solution 3 mL (3 mLs Nebulization Given 01/10/23 2000)  acetaminophen (TYLENOL) tablet 650 mg (650 mg Oral Given 01/10/23 2045)  alum & mag hydroxide-simeth (MAALOX/MYLANTA) 200-200-20 MG/5ML suspension 30 mL (30 mLs Oral Given 01/10/23 2045)     IMPRESSION / MDM / ASSESSMENT AND PLAN / ED COURSE  I reviewed the triage vital signs and the nursing notes.                              Differential diagnosis includes, but is not limited to, ingestion, aspiration, asthma, bronchitis, pharyngitis, edema  Patient presenting to the ER for evaluation of symptoms as described above.  Based on symptoms, risk factors and considered above differential, this presenting complaint could reflect a potentially life-threatening illness therefore the patient will be placed on continuous pulse oximetry and  telemetry for monitoring.  Laboratory evaluation will be sent to evaluate for the above complaints.  Protecting her airway.  Does have some mild wheeze.  May have some bronchospasm secondary to ingestion and possible aspiration.  No hypoxia.  Will give nebulizer.  No indication for further workup simple observation at this point for symptomatic management she is not having any vomiting.  Her exam is otherwise reassuring.   Clinical Course as of 01/10/23 2128  Thu Jan 10, 2023  1959 Chest x-ray on my review and interpretation without evidence of pneumothorax or consolidation. [PR]  2127 Patient has remained asymptomatic.  She does have some sore throat secondary to vomiting but her exam is otherwise reassuring.  Chest x-ray without any findings suggest aspiration.  She did feel improved after nebulizer will give albuterol she does have remote history of asthma bronchitis.  Patient does not have any additional concerns.  Now that for almost 12 hours after the ingestion I do not feel that further observation indicated. [PR]    Clinical Course User Index [PR] Merlyn Lot, MD     FINAL CLINICAL IMPRESSION(S) / ED DIAGNOSES   Final diagnoses:  Ingestion of substance, accidental or unintentional, initial encounter     Rx / DC Orders   ED Discharge Orders     None        Note:  This document was prepared using Dragon voice recognition software and may include unintentional dictation errors.    Merlyn Lot, MD 01/10/23 2128

## 2023-01-10 NOTE — ED Notes (Signed)
RN called and talk poison control about pt ingestion of soap. Per poison control they are going to sign off and encourage drinking due to GI upset.

## 2023-02-08 ENCOUNTER — Encounter: Payer: Self-pay | Admitting: Physician Assistant

## 2023-02-08 ENCOUNTER — Telehealth (INDEPENDENT_AMBULATORY_CARE_PROVIDER_SITE_OTHER): Payer: Medicare HMO | Admitting: Physician Assistant

## 2023-02-08 VITALS — Ht 67.0 in | Wt 200.0 lb

## 2023-02-08 DIAGNOSIS — F0390 Unspecified dementia without behavioral disturbance: Secondary | ICD-10-CM | POA: Diagnosis not present

## 2023-02-08 NOTE — Progress Notes (Signed)
Assessment/Plan:   Memory Impairment Maria Mays is a very pleasant 77 y.o. RH female  with a history of breast cancer, colon cancer, hypothyroidism seen today in follow up for memory loss. Patient is not on antidementia medication given advanced disease and risk outweighing the benefits (she had been on donepezil and memantine and had significant side effects in the past).  MRI brain personally reviewed was remarkable for       Follow up in   months. Continue to control mood as per PCP, she is on Remeron nightly at 7.5 mg and Paxil 20 mg daily, Vistaril. Continue 24/7 monitoring for safety with [facility] Recommend caregiver support group for family as well as psychotherapy for caregiver distress Increase water intake*** Continue control of cardiovascular risk factors    Subjective:    This patient is accompanied in the office by *** who supplements the history.  Previous records as well as any outside records available were reviewed prior to todays visit. Patient was last seen on ***   Any changes in memory since last visit?  She is essentially nonverbal or interactive.  She does not remember the names of her family members.  Both short-term and long-term memory are affected.  She requires redirection and cueing*** repeats oneself?  She repeats "I want to go home " Disoriented when walking into a room?  She does not recognize her house as her home.  She confuses places frequently, not recognizing the bathroom resulting in "accidents ". Leaving objects in unusual places?  "All the time "especially her son's belongings. She talks about her mom . In occasions she has been entertaineng, dhe is only child   Wandering behavior?  denies   Any personality changes since last visit?  denies  ". She tries to hurt the cats. *** Guizmo. She gets a little roudy with hsometimes she might look depresed  Hallucinations or paranoia?  denies   Seizures?    denies    Any sleep changes?  She has  insomnia, and takes Remeron with good results.***Denies vivid dreams, REM behavior or sleepwalking   Sleep apnea?   denies   Any hygiene concerns?  When in the afternoon, her gdtr in charge  Independent of bathing and dressing?  Endorsed  Does the patient needs help with medications?  Daughter and granddaughter are in charge *** Who is in charge of the finances?  Daughter and granddaughter are in charge   *** Any changes in appetite?  She forgets that she ate and she eats again.  She finds sugar needs spoonfuls, she does not like to drink water,tolerates warm water better.***   Patient have trouble swallowing?  denies   Does the patient cook?  No Any headaches?   denies   Chronic back pain  denies   Ambulates with difficulty?  She is in a wheelchair for comfort, to prevent falls. Recent falls or head injuries? denies     Unilateral weakness, numbness or tingling?    denies   Any tremors?  denies   Any anosmia?  Patient denies   Any incontinence of urine? Incontinence is worse and she does not realized and she tried to to use a second diaper.  Any bowel dysfunction?   Chronic intermittent diarrhea     Patient lives with her granddaughter and son  Does the patient drive?  She no longer drives     History on Initial Assessment 06/2017: This is a 77 year old right-handed woman with a history  of breast cancer, colon cancer, hypothyroidism, presenting for evaluation of worsening memory. When asked about her memory, she states "I can notice a little problem sometimes." She lives with her husband and son. Her 2 daughters reports memory changes started after her surgeries in 2011/2012, she cannot remember within a conversation what they were talking about. She repeats herself. She misplaces thing frequently. She used to be in charge of bills but when they started doing online banking, her husband took over. She stopped driving a year ago, family requested her to stop, she denied getting lost driving. She  occasionally forgets her medications. Her daughters report she is more paranoid now than she used to be, she would think people walking outside would come in and steal from them. No hallucinations. She has always had trouble with hoarding, but their house is worse over the years, they are trying to get her into counseling. No difficulties with ADLs. She has trouble letting go and can focus on things "worrying herself to death." They report she has always been this way but is has worsened.  She has occasional sinus/allergy headaches. She denies any dizziness, diplopia, dysarthria/dysphagia, neck pain, focal numbness/tingling/weakness, bowel/bladder dysfunction, anosmia, tremors. She had occasional back pain. She fell a couple of weeks ago and has a right boot s/p surgery for hammertoe/bunion. Her mother had memory issues. She denies any history of significant head injury, no alcohol use.   PREVIOUS MEDICATIONS: Memantine and donepezil  CURRENT MEDICATIONS:  Outpatient Encounter Medications as of 02/08/2023  Medication Sig   albuterol (PROVENTIL) (2.5 MG/3ML) 0.083% nebulizer solution Take 2.5 mg by nebulization as needed for wheezing or shortness of breath.   Calcium Carbonate-Vitamin D3 600-400 MG-UNIT TABS Take 1 tablet by mouth 2 (two) times daily.   diphenoxylate-atropine (LOMOTIL) 2.5-0.025 MG tablet Take 1 tablet by mouth 2 (two) times daily.   hydrOXYzine (VISTARIL) 25 MG capsule Take 25-50 mg by mouth 3 (three) times daily as needed.   levothyroxine (SYNTHROID) 100 MCG tablet Take 100 mcg by mouth daily.   loperamide (IMODIUM) 2 MG capsule Take 2 mg by mouth as needed for diarrhea or loose stools.   Loratadine (CLARITIN PO) Take by mouth daily.   mirtazapine (REMERON) 7.5 MG tablet Take 1 tablet (7.5 mg total) by mouth at bedtime.   Multiple Vitamins-Minerals (VITAMIN D3 COMPLETE) TABS Take 1 tablet by mouth at bedtime.   PARoxetine (PAXIL) 20 MG tablet Take 20 mg by mouth at bedtime.    No  facility-administered encounter medications on file as of 02/08/2023.        No data to display            03/17/2019    9:00 AM 07/19/2017    1:00 PM  Montreal Cognitive Assessment   Visuospatial/ Executive (0/5) 0 5  Naming (0/3) 0 3  Attention: Read list of digits (0/2) 2 2  Attention: Read list of letters (0/1) 1 1  Attention: Serial 7 subtraction starting at 100 (0/3) 3 3  Language: Repeat phrase (0/2) 2 2  Language : Fluency (0/1) 0 1  Abstraction (0/2) 2 2  Delayed Recall (0/5) 0 1  Orientation (0/6) 3 6  Total 13 26  Adjusted Score (based on education) 14     Objective:     PHYSICAL EXAMINATION:    VITALS:  There were no vitals filed for this visit.  GEN:  The patient appears stated age and is in NAD. HEENT:  Normocephalic, atraumatic.   Neurological examination:  General: NAD, well-groomed, appears stated age. Orientation: The patient is alert.  Not oriented to person, place or date. Cranial nerves: There is good facial symmetry.The speech is fluent and clear. No aphasia or dysarthria. Fund of knowledge is reduced. Recent and remote memory are impaired. Attention and concentration are reduced.  Unable to name objects and repeat phrases.  Hearing is intact to conversational tone. *** Sensation: Sensation is intact to light touch throughout Motor: Strength is at least antigravity x4. DTR's 2/4 in UE/LE     Movement examination: Tone: There is normal tone in the UE/LE Abnormal movements:  no tremor.  No myoclonus.  No asterixis.   Coordination:  There is decremation with RAM's.  Abnormal finger to nose  Gait and Station: patient is on wheelchair, gait not tested  Thank you for allowing [place] the opportunity to participate in the care of this nice patient. Please do not hesitate to contact us for any questions or concerns.   Total time spent on today's visit was *** minutes dedicated to this patient today, preparing to see patient, examining the patient,  ordering tests and/or medications and counseling the patient, documenting clinical information in the EHR or other health record, independently interpreting results and communicating results to the patient/family, discussing treatment and goals, answering patient's questions and coordinating care.  Cc:  Donnie Coffin, MD  Sharene Butters 02/08/2023 7:28 AM

## 2024-03-19 DEATH — deceased
